# Patient Record
Sex: Female | Born: 1980 | Race: White | Hispanic: Yes | Marital: Married | Smoking: Never smoker
Health system: Southern US, Community
[De-identification: ages and names within clinical notes are randomized; demographics above are authoritative.]

## PROBLEM LIST (undated history)

## (undated) DIAGNOSIS — I10 Essential (primary) hypertension: Secondary | ICD-10-CM

## (undated) DIAGNOSIS — E119 Type 2 diabetes mellitus without complications: Secondary | ICD-10-CM

## (undated) DIAGNOSIS — O139 Gestational [pregnancy-induced] hypertension without significant proteinuria, unspecified trimester: Secondary | ICD-10-CM

## (undated) HISTORY — DX: Gestational (pregnancy-induced) hypertension without significant proteinuria, unspecified trimester: O13.9

## (undated) HISTORY — PX: NO PAST SURGERIES: SHX2092

---

## 2001-02-17 ENCOUNTER — Emergency Department (HOSPITAL_COMMUNITY): Admission: EM | Admit: 2001-02-17 | Discharge: 2001-02-17 | Payer: Self-pay | Admitting: Emergency Medicine

## 2001-02-17 ENCOUNTER — Encounter: Payer: Self-pay | Admitting: Emergency Medicine

## 2001-09-05 ENCOUNTER — Encounter: Payer: Self-pay | Admitting: *Deleted

## 2001-09-05 ENCOUNTER — Inpatient Hospital Stay (HOSPITAL_COMMUNITY): Admission: AD | Admit: 2001-09-05 | Discharge: 2001-09-05 | Payer: Self-pay | Admitting: *Deleted

## 2001-09-06 ENCOUNTER — Inpatient Hospital Stay (HOSPITAL_COMMUNITY): Admission: AD | Admit: 2001-09-06 | Discharge: 2001-09-06 | Payer: Self-pay | Admitting: *Deleted

## 2001-09-07 ENCOUNTER — Inpatient Hospital Stay (HOSPITAL_COMMUNITY): Admission: AD | Admit: 2001-09-07 | Discharge: 2001-09-10 | Payer: Self-pay | Admitting: Obstetrics and Gynecology

## 2005-02-20 ENCOUNTER — Inpatient Hospital Stay (HOSPITAL_COMMUNITY): Admission: AD | Admit: 2005-02-20 | Discharge: 2005-02-20 | Payer: Self-pay | Admitting: *Deleted

## 2005-05-21 ENCOUNTER — Inpatient Hospital Stay (HOSPITAL_COMMUNITY): Admission: AD | Admit: 2005-05-21 | Discharge: 2005-05-22 | Payer: Self-pay | Admitting: Obstetrics & Gynecology

## 2005-06-12 ENCOUNTER — Ambulatory Visit: Payer: Self-pay | Admitting: *Deleted

## 2005-06-26 ENCOUNTER — Ambulatory Visit: Payer: Self-pay | Admitting: Family Medicine

## 2005-06-26 ENCOUNTER — Ambulatory Visit (HOSPITAL_COMMUNITY): Admission: RE | Admit: 2005-06-26 | Discharge: 2005-06-26 | Payer: Self-pay | Admitting: Obstetrics & Gynecology

## 2005-07-10 ENCOUNTER — Ambulatory Visit: Payer: Self-pay | Admitting: Family Medicine

## 2005-07-24 ENCOUNTER — Ambulatory Visit: Payer: Self-pay | Admitting: *Deleted

## 2005-07-31 ENCOUNTER — Ambulatory Visit: Payer: Self-pay | Admitting: *Deleted

## 2005-07-31 ENCOUNTER — Ambulatory Visit (HOSPITAL_COMMUNITY): Admission: RE | Admit: 2005-07-31 | Discharge: 2005-07-31 | Payer: Self-pay | Admitting: Obstetrics & Gynecology

## 2005-08-03 ENCOUNTER — Ambulatory Visit: Payer: Self-pay | Admitting: Obstetrics & Gynecology

## 2005-08-07 ENCOUNTER — Ambulatory Visit: Payer: Self-pay | Admitting: *Deleted

## 2005-08-10 ENCOUNTER — Ambulatory Visit: Payer: Self-pay | Admitting: *Deleted

## 2005-08-13 ENCOUNTER — Ambulatory Visit: Payer: Self-pay | Admitting: *Deleted

## 2005-08-16 ENCOUNTER — Ambulatory Visit: Payer: Self-pay | Admitting: *Deleted

## 2005-08-20 ENCOUNTER — Ambulatory Visit: Payer: Self-pay | Admitting: *Deleted

## 2005-08-22 ENCOUNTER — Ambulatory Visit: Payer: Self-pay | Admitting: Obstetrics & Gynecology

## 2005-08-22 ENCOUNTER — Ambulatory Visit (HOSPITAL_COMMUNITY): Admission: RE | Admit: 2005-08-22 | Discharge: 2005-08-22 | Payer: Self-pay | Admitting: *Deleted

## 2005-08-30 ENCOUNTER — Ambulatory Visit: Payer: Self-pay | Admitting: *Deleted

## 2005-09-03 ENCOUNTER — Ambulatory Visit: Payer: Self-pay | Admitting: *Deleted

## 2005-09-05 ENCOUNTER — Ambulatory Visit: Payer: Self-pay | Admitting: Obstetrics & Gynecology

## 2005-09-07 ENCOUNTER — Inpatient Hospital Stay (HOSPITAL_COMMUNITY): Admission: AD | Admit: 2005-09-07 | Discharge: 2005-09-07 | Payer: Self-pay | Admitting: Obstetrics & Gynecology

## 2005-09-07 ENCOUNTER — Ambulatory Visit: Payer: Self-pay | Admitting: Family Medicine

## 2005-09-08 ENCOUNTER — Ambulatory Visit: Payer: Self-pay | Admitting: Family Medicine

## 2005-09-08 ENCOUNTER — Inpatient Hospital Stay (HOSPITAL_COMMUNITY): Admission: AD | Admit: 2005-09-08 | Discharge: 2005-09-10 | Payer: Self-pay | Admitting: Gynecology

## 2007-01-02 ENCOUNTER — Inpatient Hospital Stay (HOSPITAL_COMMUNITY): Admission: AD | Admit: 2007-01-02 | Discharge: 2007-01-03 | Payer: Self-pay | Admitting: Gynecology

## 2007-06-04 ENCOUNTER — Ambulatory Visit: Payer: Self-pay | Admitting: Obstetrics & Gynecology

## 2007-06-04 ENCOUNTER — Ambulatory Visit (HOSPITAL_COMMUNITY): Admission: RE | Admit: 2007-06-04 | Discharge: 2007-06-04 | Payer: Self-pay | Admitting: Gynecology

## 2007-06-09 ENCOUNTER — Encounter: Admission: RE | Admit: 2007-06-09 | Discharge: 2007-06-09 | Payer: Self-pay | Admitting: Obstetrics & Gynecology

## 2007-06-09 ENCOUNTER — Ambulatory Visit: Payer: Self-pay | Admitting: Obstetrics & Gynecology

## 2007-06-09 ENCOUNTER — Encounter: Payer: Self-pay | Admitting: Obstetrics & Gynecology

## 2007-06-16 ENCOUNTER — Ambulatory Visit: Payer: Self-pay | Admitting: Obstetrics & Gynecology

## 2007-06-23 ENCOUNTER — Ambulatory Visit: Payer: Self-pay | Admitting: Obstetrics & Gynecology

## 2007-06-23 ENCOUNTER — Inpatient Hospital Stay (HOSPITAL_COMMUNITY): Admission: AD | Admit: 2007-06-23 | Discharge: 2007-06-25 | Payer: Self-pay | Admitting: Gynecology

## 2007-07-08 ENCOUNTER — Inpatient Hospital Stay (HOSPITAL_COMMUNITY): Admission: AD | Admit: 2007-07-08 | Discharge: 2007-07-11 | Payer: Self-pay | Admitting: Obstetrics & Gynecology

## 2007-07-08 ENCOUNTER — Ambulatory Visit: Payer: Self-pay | Admitting: *Deleted

## 2008-11-16 ENCOUNTER — Encounter: Payer: Self-pay | Admitting: Obstetrics & Gynecology

## 2008-11-16 ENCOUNTER — Ambulatory Visit: Payer: Self-pay | Admitting: Obstetrics and Gynecology

## 2008-11-16 LAB — CONVERTED CEMR LAB
Eosinophils Absolute: 0 10*3/uL (ref 0.0–0.7)
HCT: 39.9 % (ref 36.0–46.0)
Hemoglobin: 13.8 g/dL (ref 12.0–15.0)
Hepatitis B Surface Ag: NEGATIVE
Lymphs Abs: 2 10*3/uL (ref 0.7–4.0)
MCV: 91.3 fL (ref 78.0–100.0)
Neutrophils Relative %: 77 % (ref 43–77)
Rh Type: POSITIVE
Rubella: 328.4 intl units/mL — ABNORMAL HIGH

## 2008-11-17 ENCOUNTER — Ambulatory Visit: Payer: Self-pay | Admitting: Obstetrics & Gynecology

## 2008-11-17 ENCOUNTER — Encounter: Payer: Self-pay | Admitting: Physician Assistant

## 2008-11-17 LAB — CONVERTED CEMR LAB: Chlamydia, Swab/Urine, PCR: NEGATIVE

## 2008-11-22 ENCOUNTER — Ambulatory Visit (HOSPITAL_COMMUNITY): Admission: RE | Admit: 2008-11-22 | Discharge: 2008-11-22 | Payer: Self-pay | Admitting: Obstetrics and Gynecology

## 2008-12-15 ENCOUNTER — Ambulatory Visit: Payer: Self-pay | Admitting: Obstetrics & Gynecology

## 2008-12-16 ENCOUNTER — Encounter: Payer: Self-pay | Admitting: Obstetrics and Gynecology

## 2008-12-20 ENCOUNTER — Ambulatory Visit: Payer: Self-pay | Admitting: Obstetrics & Gynecology

## 2008-12-20 ENCOUNTER — Encounter: Admission: RE | Admit: 2008-12-20 | Discharge: 2009-03-20 | Payer: Self-pay | Admitting: Obstetrics and Gynecology

## 2008-12-20 ENCOUNTER — Ambulatory Visit (HOSPITAL_COMMUNITY): Admission: RE | Admit: 2008-12-20 | Discharge: 2008-12-20 | Payer: Self-pay | Admitting: Obstetrics & Gynecology

## 2008-12-27 ENCOUNTER — Ambulatory Visit: Payer: Self-pay | Admitting: Obstetrics & Gynecology

## 2009-01-03 ENCOUNTER — Ambulatory Visit: Payer: Self-pay | Admitting: Obstetrics & Gynecology

## 2009-01-17 ENCOUNTER — Ambulatory Visit: Payer: Self-pay | Admitting: Obstetrics & Gynecology

## 2009-01-19 ENCOUNTER — Ambulatory Visit (HOSPITAL_COMMUNITY): Admission: RE | Admit: 2009-01-19 | Discharge: 2009-01-19 | Payer: Self-pay | Admitting: Family Medicine

## 2009-01-31 ENCOUNTER — Ambulatory Visit: Payer: Self-pay | Admitting: Obstetrics & Gynecology

## 2009-01-31 ENCOUNTER — Encounter: Payer: Self-pay | Admitting: Obstetrics & Gynecology

## 2009-02-21 ENCOUNTER — Ambulatory Visit: Payer: Self-pay | Admitting: Obstetrics & Gynecology

## 2009-02-21 LAB — CONVERTED CEMR LAB
HCT: 36.4 % (ref 36.0–46.0)
MCHC: 33.2 g/dL (ref 30.0–36.0)
MCV: 96 fL (ref 78.0–100.0)
Platelets: 184 10*3/uL (ref 150–400)
RDW: 13.6 % (ref 11.5–15.5)
WBC: 10.5 10*3/uL (ref 4.0–10.5)

## 2009-02-24 ENCOUNTER — Ambulatory Visit (HOSPITAL_COMMUNITY): Admission: RE | Admit: 2009-02-24 | Discharge: 2009-02-24 | Payer: Self-pay | Admitting: Family Medicine

## 2009-02-28 ENCOUNTER — Ambulatory Visit: Payer: Self-pay | Admitting: Obstetrics & Gynecology

## 2009-03-17 ENCOUNTER — Ambulatory Visit: Payer: Self-pay | Admitting: Obstetrics & Gynecology

## 2009-03-24 ENCOUNTER — Ambulatory Visit: Payer: Self-pay | Admitting: Obstetrics & Gynecology

## 2009-03-28 ENCOUNTER — Ambulatory Visit: Payer: Self-pay | Admitting: Obstetrics & Gynecology

## 2009-03-28 ENCOUNTER — Ambulatory Visit (HOSPITAL_COMMUNITY): Admission: RE | Admit: 2009-03-28 | Discharge: 2009-03-28 | Payer: Self-pay | Admitting: Obstetrics and Gynecology

## 2009-03-31 ENCOUNTER — Ambulatory Visit: Payer: Self-pay | Admitting: Obstetrics & Gynecology

## 2009-04-07 ENCOUNTER — Ambulatory Visit: Payer: Self-pay | Admitting: Obstetrics & Gynecology

## 2009-04-11 ENCOUNTER — Ambulatory Visit: Payer: Self-pay | Admitting: Family Medicine

## 2009-04-11 ENCOUNTER — Encounter: Admission: RE | Admit: 2009-04-11 | Discharge: 2009-04-11 | Payer: Self-pay | Admitting: Obstetrics & Gynecology

## 2009-04-14 ENCOUNTER — Ambulatory Visit: Payer: Self-pay | Admitting: Obstetrics & Gynecology

## 2009-04-21 ENCOUNTER — Ambulatory Visit: Payer: Self-pay | Admitting: Obstetrics & Gynecology

## 2009-04-25 ENCOUNTER — Ambulatory Visit: Payer: Self-pay | Admitting: Obstetrics & Gynecology

## 2009-04-26 ENCOUNTER — Encounter: Payer: Self-pay | Admitting: Family

## 2009-05-02 ENCOUNTER — Ambulatory Visit (HOSPITAL_COMMUNITY): Admission: RE | Admit: 2009-05-02 | Discharge: 2009-05-02 | Payer: Self-pay | Admitting: Family Medicine

## 2009-05-02 ENCOUNTER — Ambulatory Visit: Payer: Self-pay | Admitting: Obstetrics & Gynecology

## 2009-05-04 ENCOUNTER — Ambulatory Visit: Payer: Self-pay | Admitting: Obstetrics & Gynecology

## 2009-05-09 ENCOUNTER — Ambulatory Visit: Payer: Self-pay | Admitting: Obstetrics & Gynecology

## 2009-05-13 ENCOUNTER — Inpatient Hospital Stay (HOSPITAL_COMMUNITY): Admission: AD | Admit: 2009-05-13 | Discharge: 2009-05-16 | Payer: Self-pay | Admitting: Obstetrics & Gynecology

## 2009-05-13 ENCOUNTER — Ambulatory Visit: Payer: Self-pay | Admitting: Obstetrics & Gynecology

## 2009-08-13 ENCOUNTER — Emergency Department (HOSPITAL_COMMUNITY): Admission: EM | Admit: 2009-08-13 | Discharge: 2009-08-13 | Payer: Self-pay | Admitting: Psychiatry

## 2010-07-02 ENCOUNTER — Encounter: Payer: Self-pay | Admitting: *Deleted

## 2010-09-12 LAB — GLUCOSE, CAPILLARY
Glucose-Capillary: 47 mg/dL — ABNORMAL LOW (ref 70–99)
Glucose-Capillary: 74 mg/dL (ref 70–99)
Glucose-Capillary: 79 mg/dL (ref 70–99)

## 2010-09-12 LAB — CBC
HCT: 37.9 % (ref 36.0–46.0)
RBC: 3.92 MIL/uL (ref 3.87–5.11)

## 2010-09-12 LAB — WET PREP, GENITAL: Clue Cells Wet Prep HPF POC: NONE SEEN

## 2010-09-13 LAB — POCT URINALYSIS DIP (DEVICE)
Bilirubin Urine: NEGATIVE
Bilirubin Urine: NEGATIVE
Bilirubin Urine: NEGATIVE
Glucose, UA: NEGATIVE mg/dL
Glucose, UA: NEGATIVE mg/dL
Glucose, UA: NEGATIVE mg/dL
Hgb urine dipstick: NEGATIVE
Ketones, ur: NEGATIVE mg/dL
Ketones, ur: NEGATIVE mg/dL
Ketones, ur: NEGATIVE mg/dL
Nitrite: NEGATIVE
Nitrite: NEGATIVE
Protein, ur: NEGATIVE mg/dL
Protein, ur: NEGATIVE mg/dL
Protein, ur: NEGATIVE mg/dL
Specific Gravity, Urine: 1.01 (ref 1.005–1.030)
Specific Gravity, Urine: 1.01 (ref 1.005–1.030)
Specific Gravity, Urine: 1.015 (ref 1.005–1.030)
Urobilinogen, UA: 0.2 mg/dL (ref 0.0–1.0)
Urobilinogen, UA: 0.2 mg/dL (ref 0.0–1.0)
Urobilinogen, UA: 0.2 mg/dL (ref 0.0–1.0)

## 2010-09-14 LAB — POCT URINALYSIS DIP (DEVICE)
Bilirubin Urine: NEGATIVE
Bilirubin Urine: NEGATIVE
Glucose, UA: NEGATIVE mg/dL
Glucose, UA: NEGATIVE mg/dL
Hgb urine dipstick: NEGATIVE
Ketones, ur: NEGATIVE mg/dL
Ketones, ur: NEGATIVE mg/dL
Ketones, ur: NEGATIVE mg/dL
Nitrite: NEGATIVE
Specific Gravity, Urine: 1.01 (ref 1.005–1.030)
Specific Gravity, Urine: 1.015 (ref 1.005–1.030)

## 2010-09-15 LAB — POCT URINALYSIS DIP (DEVICE)
Bilirubin Urine: NEGATIVE
Bilirubin Urine: NEGATIVE
Glucose, UA: NEGATIVE mg/dL
Hgb urine dipstick: NEGATIVE
Hgb urine dipstick: NEGATIVE
Ketones, ur: NEGATIVE mg/dL
Ketones, ur: NEGATIVE mg/dL
Specific Gravity, Urine: 1.02 (ref 1.005–1.030)
Specific Gravity, Urine: 1.02 (ref 1.005–1.030)
pH: 7 (ref 5.0–8.0)

## 2010-09-16 LAB — POCT URINALYSIS DIP (DEVICE)
Bilirubin Urine: NEGATIVE
Ketones, ur: NEGATIVE mg/dL
Ketones, ur: NEGATIVE mg/dL
Protein, ur: NEGATIVE mg/dL
Specific Gravity, Urine: 1.015 (ref 1.005–1.030)
pH: 8 (ref 5.0–8.0)

## 2010-09-17 LAB — POCT URINALYSIS DIP (DEVICE)
Bilirubin Urine: NEGATIVE
Bilirubin Urine: NEGATIVE
Bilirubin Urine: NEGATIVE
Glucose, UA: NEGATIVE mg/dL
Glucose, UA: NEGATIVE mg/dL
Glucose, UA: NEGATIVE mg/dL
Glucose, UA: NEGATIVE mg/dL
Ketones, ur: NEGATIVE mg/dL
Nitrite: NEGATIVE
Protein, ur: 30 mg/dL — AB
Specific Gravity, Urine: 1.02 (ref 1.005–1.030)
Specific Gravity, Urine: 1.02 (ref 1.005–1.030)
Urobilinogen, UA: 0.2 mg/dL (ref 0.0–1.0)
Urobilinogen, UA: 0.2 mg/dL (ref 0.0–1.0)
pH: 7.5 (ref 5.0–8.0)

## 2010-09-17 LAB — GLUCOSE, CAPILLARY: Glucose-Capillary: 82 mg/dL (ref 70–99)

## 2010-09-18 LAB — POCT URINALYSIS DIP (DEVICE)
Glucose, UA: NEGATIVE mg/dL
Nitrite: NEGATIVE
Specific Gravity, Urine: 1.02 (ref 1.005–1.030)
Urobilinogen, UA: 0.2 mg/dL (ref 0.0–1.0)

## 2010-10-24 NOTE — Discharge Summary (Signed)
Virginia Dyer, Virginia Dyer      ACCOUNT NO.:  [id number]   MEDICAL RECORD NO.:  [id number]          PATIENT TYPE:  INP   LOCATION:  9311                          FACILITY:  [facility]   PHYSICIAN:  Allie Bossier, MD        DATE OF BIRTH:  02/07/1977   DATE OF ADMISSION:  07/08/2007  DATE OF DISCHARGE:  07/11/2007                               DISCHARGE SUMMARY   ADMITTING DIAGNOSES:  1. Fever.  2. Lower abdominal pain.  3. Vaginal discharge.  4. Postpartum  day #15.  5. Spontaneous vaginal delivery.   DISCHARGE DIAGNOSES:  1. Endomyometritis, afebrile times 24 hours.  2. Postpartum  day #18.  3. Spontaneous vaginal delivery.   PROCEDURES:  1. The patient had a CT of the abdomen and pelvis with contrast      showing increased soft tissue density and fluid within the uterine      cavity suggesting possible retained parts of conception.  There was      no evidence for appendicitis.  2. There was a gynecological ultrasound of the uterus showing fluid in      the uterus with low likelihood of any retained products of      conception.  Report is not available for me to review. This is      verbal from the radiologist.  3. The patient had a pelvic ultrasound that showed only minimal      irregularity of the endometrial canal anteriorly suggesting minimal      if any retained products.  Otherwise normal postpartum  appearance.   CONSULTATIONS:  None.   COMPLICATIONS:  None.   PERTINENT LABORATORY FINDINGS:  On admission the patient had a CBC with  white blood cell count 12.8, hemoglobin 12.3, hematocrit 35.5, platelets  197, neutrophils 84, lymphocytes 11.  Urinalysis had small bilirubin,  moderate blood, 30 protein, but was otherwise negative.  Microscopy  showed few bacteria, 3 to 6 white blood cells, 3 to 6 red blood cells.  Wet prep was negative.  Complete metabolic panel showed sodium 137,  potassium 3.8, glucose 103, BUN 14, creatinine 0.62.  AST 21, ALT 18.  Total bilirubin  0.8.  Gonorrhea and Chlamydia probes were both negative.  Followup complete blood count on hospital day #2 showed white blood cell  count of 13.2, hemoglobin 10.6, hematocrit 30.7 and platelet count  164.  On hospital day #4 she has another CBC showing white blood cell count of  9.3, hemoglobin 10.2, hematocrit 28.6, platelets 191.   BRIEF PERTINENT ADMISSION HISTORY:  Virginia Dyer is a gravida 3, para  3, 0-0-3 presenting postpartum  day #15 from spontaneous vaginal  delivery complaining of worsening pelvic pain and discharge for the past  3 to 4 days.  She states that she had a normal  postpartum course but  developed pain beginning Saturday, January 24. She also notes a  yellowish sick looking discharge.  She states she has also bee  afebrile and slightly nauseous.  She denies vomiting or diarrhea.  She  is admitted for evaluation and management.   HOSPITAL COURSE:  Upon admission She was found to have a  temperature of  104.2.  The white blood cell count was noted to be 13.2 with a left  shift.  A CT of the abdomen and pelvis showed possible retained products  of conception. She was started on antibiotics of ampicillin,  clindamycin, and gentamicin.  Her fever persisted throughout the  hospital day 1 and into hospital day 3.  Her fevers were not as high  initially with her last temperature being on January 29 at 10:00 a.m. at  100.8.  After CT of the abdomen and pelvis showed possible products of  conception but no evidence of appendicitis an ultrasound was performed.  The ultrasound showed minimal irregularities with low likelihood of  retained products.  It was felt that there was very low likelihood for  retained products due to review of the chart showing an intact placenta  and no bleeding.  The patient's Gonorrhea and Chlamydia tests were  normal.  Initial speculum examination showed large amount of purulent  drainage from the cervical os.  On hospital day #4 prior to  discharge  she was complaining of only mild lower abdominal pain. This has improved  significantly since time of admission.  She denies subjective fever and  looked stable and in good condition for discharge.  She had been on  antibiotics times 72 plus hours and had defervesced. She was in stable  condition to be discharged.   DISCHARGE STATUS:  Stable.   DISCHARGE MEDICATIONS:  1. Ciprofloxacin 500 mg 1 tablet by mouth twice daily for 7 days.  2. Flagyl 500 mg 1 tablet every 6 hours for 7 days.   DISCHARGE INSTRUCTIONS:  1. Discharge to home .  2. Regular diet.  3. No sex for 2 weeks, otherwise activity as tolerated.  4. The patient is to follow up as needed at the [facility] or the [facility].  5. If the patient spikes high fever and develops vaginal bleeding, or      pain worsens she is to return to the maternity admissions unit.      Karlton Lemon, MD  Electronically Signed     ______________________________  Allie Bossier, MD    NS/MEDQ  D:  07/11/2007  T:  07/11/2007  Job:  [id number]

## 2011-03-01 LAB — COMPREHENSIVE METABOLIC PANEL
ALT: 13
ALT: 18
AST: 17
BUN: 14
CO2: 23
CO2: 27
Calcium: 8.8
Chloride: 105
GFR calc Af Amer: >60
GFR calc Af Amer: >60
GFR calc non Af Amer: >60
GFR calc non Af Amer: >60
Glucose, Bld: 103 — ABNORMAL HIGH
Potassium: 3.4 — ABNORMAL LOW
Sodium: 137
Total Bilirubin: 0.5
Total Protein: 6.3

## 2011-03-01 LAB — CBC
HCT: 28.6 — ABNORMAL LOW
HCT: 30.7 — ABNORMAL LOW
HCT: 35.5 — ABNORMAL LOW
Hemoglobin: 10.2 — ABNORMAL LOW
Hemoglobin: 10.6 — ABNORMAL LOW
MCHC: 35.8
MCV: 101.5 — ABNORMAL HIGH
MCV: 93.2
MCV: 93.4
Platelets: 164
Platelets: 197
RBC: 3.96
RBC: 3.3 — ABNORMAL LOW
RBC: 3.81 — ABNORMAL LOW
RDW: 13.8
WBC: 13.3 — ABNORMAL HIGH
WBC: 12.8 — ABNORMAL HIGH
WBC: 13.2 — ABNORMAL HIGH

## 2011-03-01 LAB — URINE MICROSCOPIC-ADD ON

## 2011-03-01 LAB — URINALYSIS, ROUTINE W REFLEX MICROSCOPIC
Glucose, UA: NEGATIVE
Leukocytes, UA: NEGATIVE
pH: 6.5

## 2011-03-01 LAB — DIFFERENTIAL
Eosinophils Absolute: 0
Eosinophils Relative: 0
Lymphocytes Relative: 11 — ABNORMAL LOW
Lymphs Abs: 1.4
Monocytes Relative: 5
Neutrophils Relative %: 84 — ABNORMAL HIGH

## 2011-03-01 LAB — POCT URINALYSIS DIP (DEVICE)
Glucose, UA: NEGATIVE
Nitrite: NEGATIVE
Operator id: 194561
Urobilinogen, UA: 0.2

## 2011-03-01 LAB — APTT: aPTT: 28

## 2011-03-01 LAB — WET PREP, GENITAL

## 2011-03-08 IMAGING — US US OB COMP +14 WK
2 of 3 series · 14 of 28 positions shown · non-contrast
Comparison: none

OBSTETRICAL ULTRASOUND:
 This ultrasound exam was performed in the [HOSPITAL] Ultrasound Department.  The OB US report was generated in the AS system, and faxed to the ordering physician.  This report is also available in [REDACTED] PACS.

[Series 1: us ob comp +14 wk · 0.21mm/px · 2 of 10 slices shown (1 of 2)]
[im 1/10]
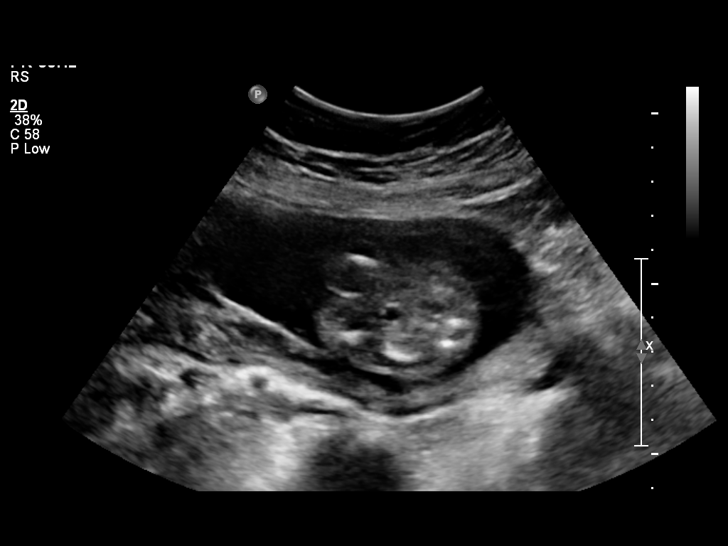
[im 7/10]
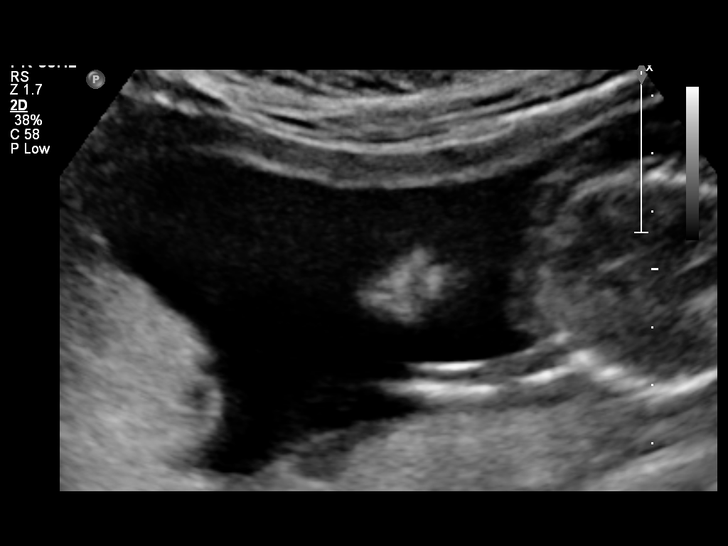

[Series 1: us ob comp +14 wk · 0.21mm/px · 66 acquisitions, 12 frames shown (2 of 2)]
[im 1/66]
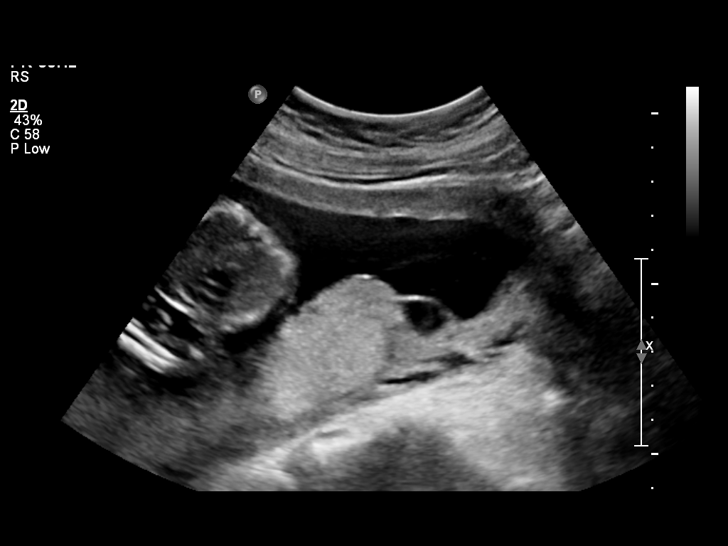
[im 6/66]
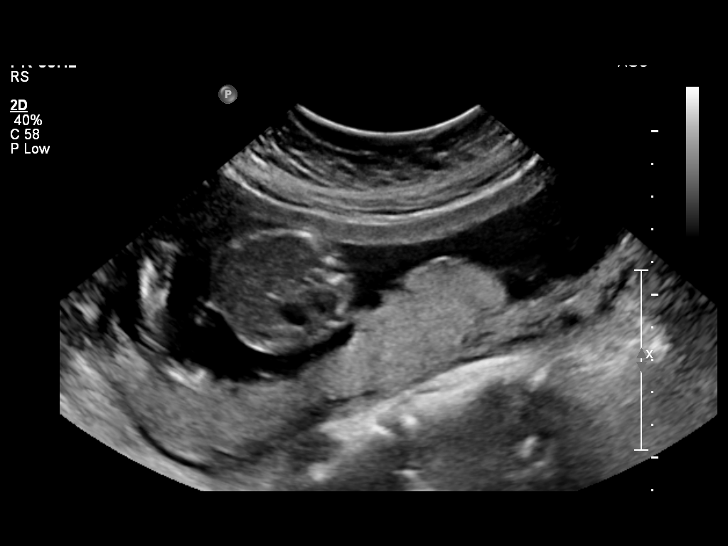
[im 12/66]
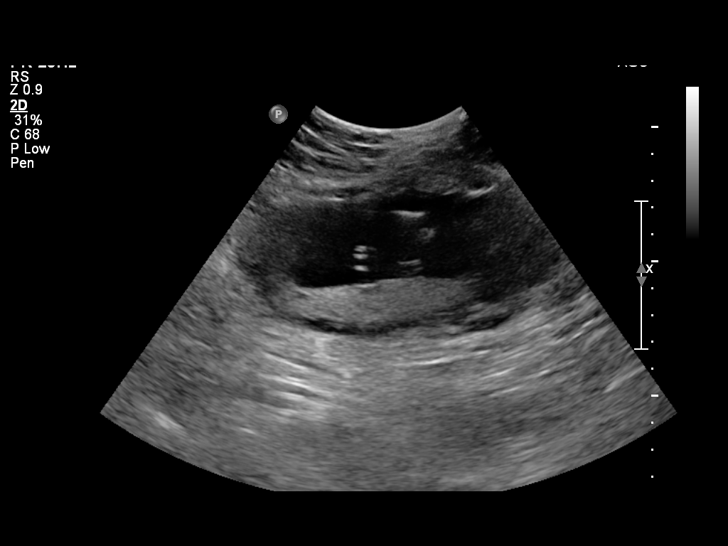
[im 18/66]
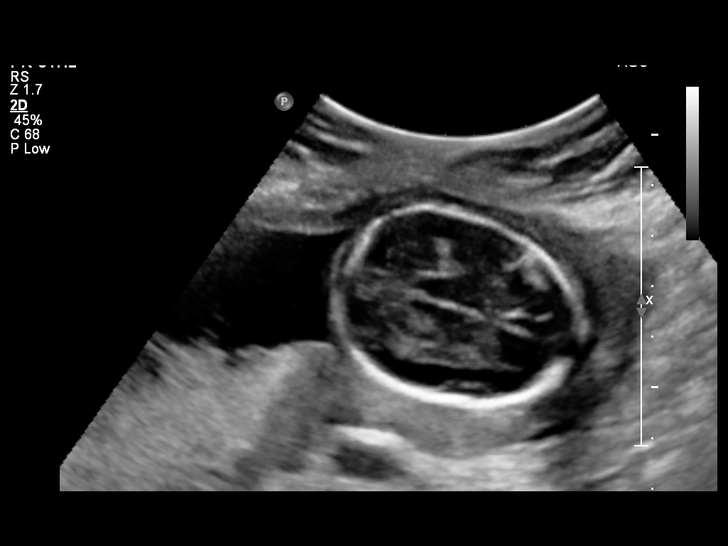
[im 24/66]
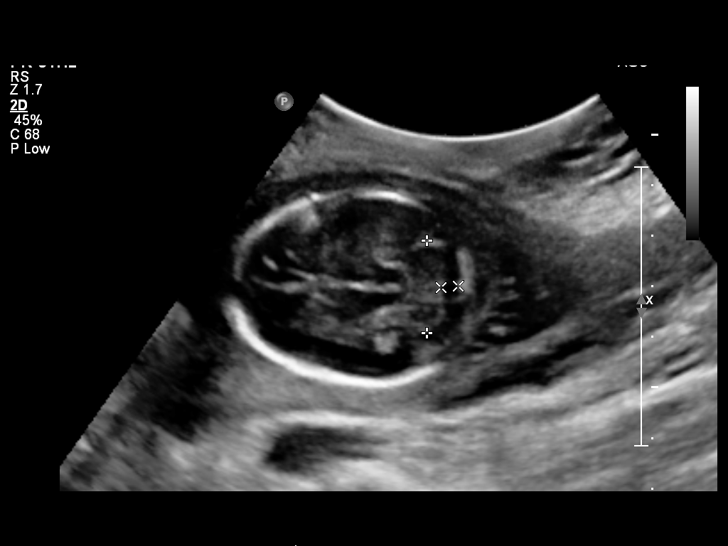
[im 30/66]
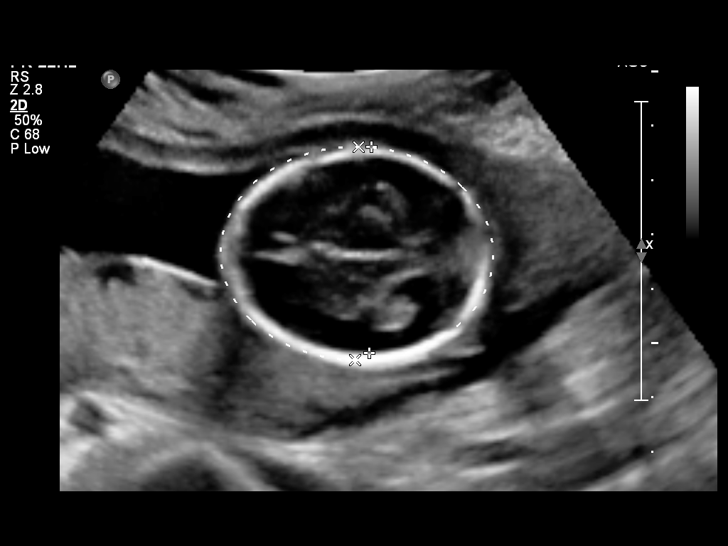
[im 36/66]
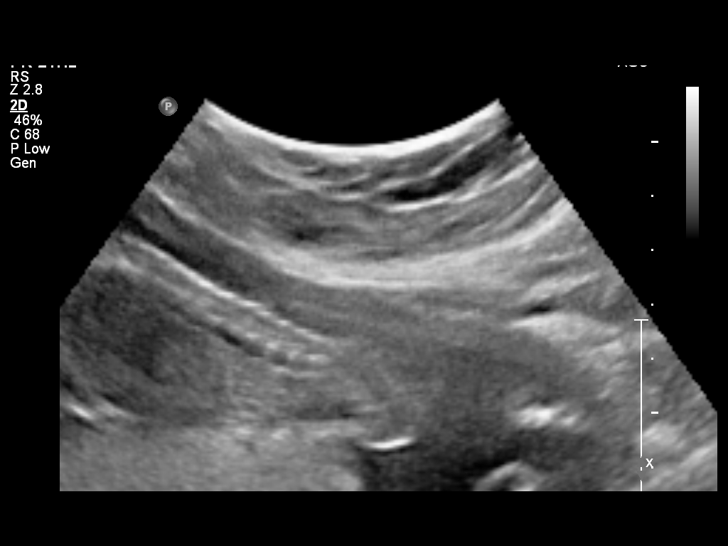
[im 42/66]
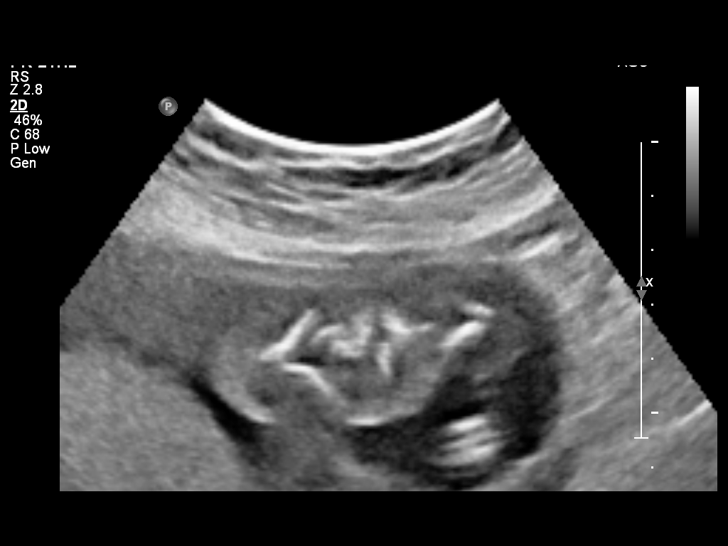
[im 48/66]
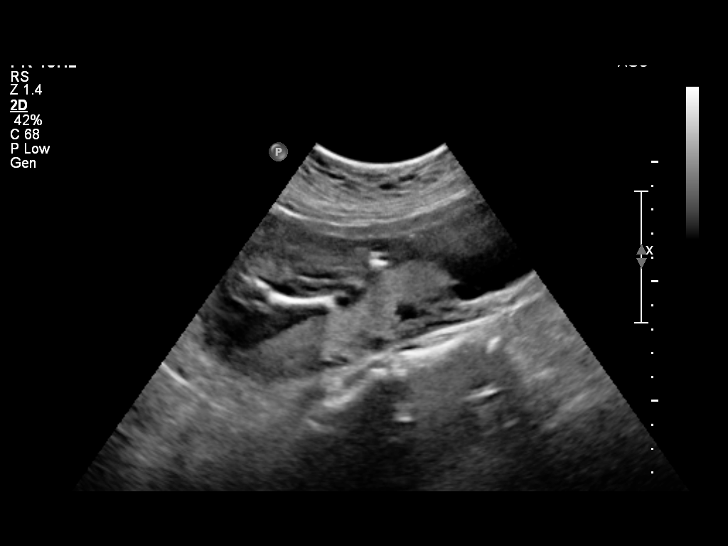
[im 54/66]
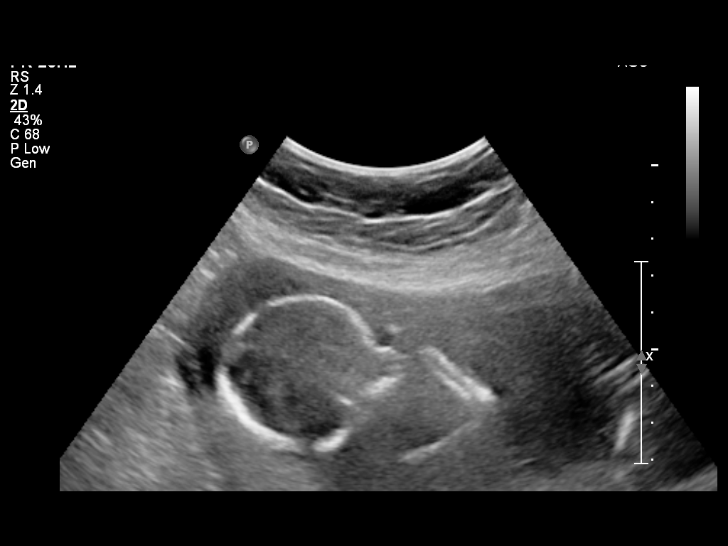
[im 60/66]
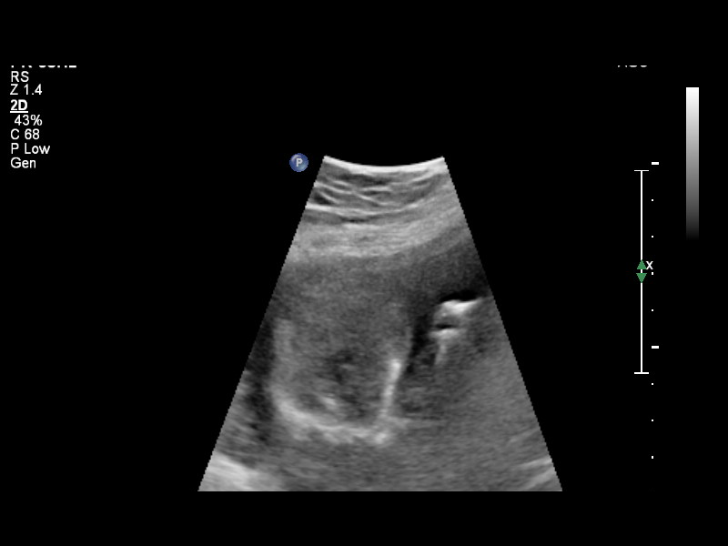
[im 66/66]
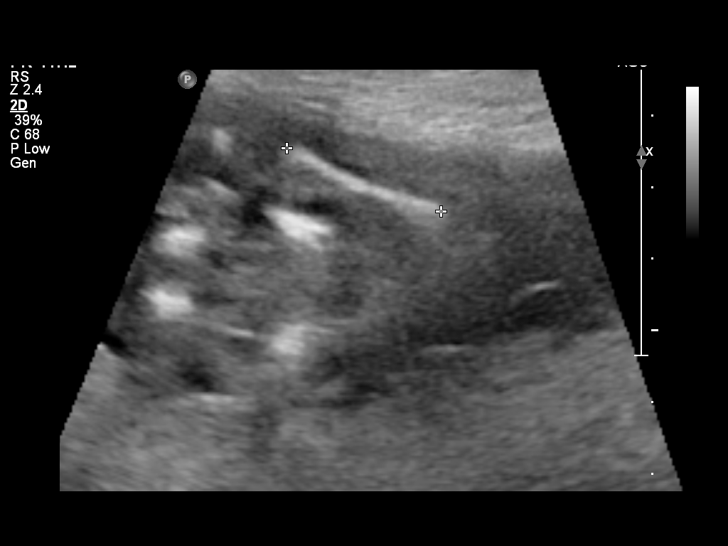

[14 of 28 positions shown; findings below may reference images not displayed]

IMPRESSION: See AS Obstetric US report.

## 2011-03-16 LAB — POCT URINALYSIS DIP (DEVICE)
Bilirubin Urine: NEGATIVE
Bilirubin Urine: NEGATIVE
Nitrite: NEGATIVE
Nitrite: NEGATIVE
Protein, ur: NEGATIVE
Protein, ur: NEGATIVE
pH: 7.5
pH: 7.5

## 2011-03-26 LAB — URINALYSIS, ROUTINE W REFLEX MICROSCOPIC
Bilirubin Urine: NEGATIVE
Specific Gravity, Urine: <1.005 — ABNORMAL LOW
Urobilinogen, UA: 0.2
pH: 6

## 2011-03-26 LAB — CBC
Platelets: 176
RDW: 14

## 2011-03-26 LAB — URINE MICROSCOPIC-ADD ON: WBC, UA: NONE SEEN

## 2011-03-26 LAB — ABO/RH: ABO/RH(D): O POS

## 2011-03-26 LAB — WET PREP, GENITAL: Yeast Wet Prep HPF POC: NONE SEEN

## 2011-03-26 LAB — POCT PREGNANCY, URINE
Operator id: 114931
Preg Test, Ur: POSITIVE

## 2011-04-07 IMAGING — US US OB FOLLOW-UP
2 series · 14 of 28 positions shown · non-contrast
Comparison: none

OBSTETRICAL ULTRASOUND:
 This ultrasound exam was performed in the [HOSPITAL] Ultrasound Department.  The OB US report was generated in the AS system, and faxed to the ordering physician.  This report is also available in [REDACTED] PACS.

[Series 1: us ob follow up · 0.27mm/px · 4 acquisitions, 1 frame shown (1 of 2)]
[im 4/4]
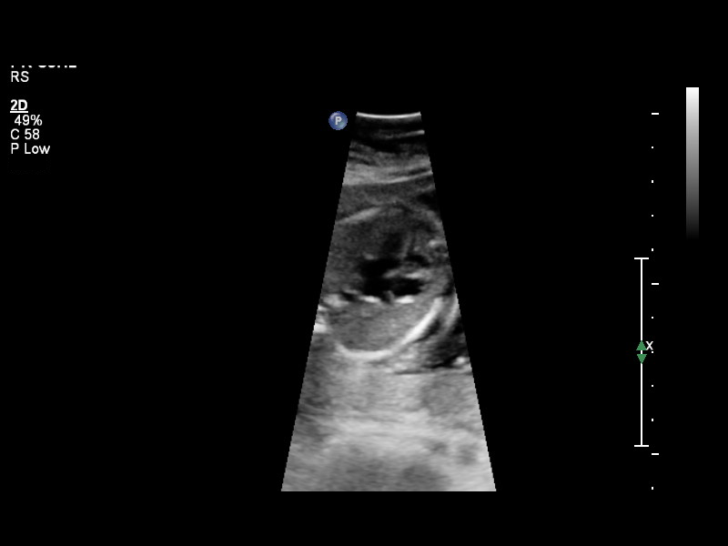

[Series 1: us ob follow up · 0.21mm/px · 13 of 44 slices shown (2 of 2)]
[im 2/44]
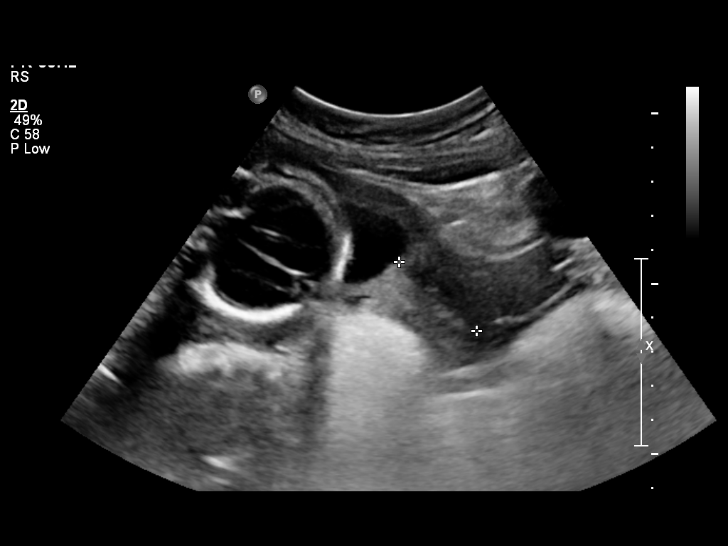
[im 6/44]
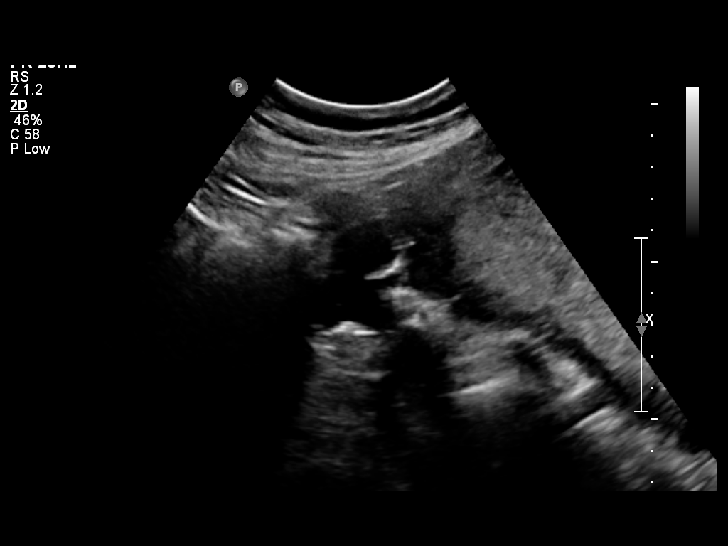
[im 9/44]
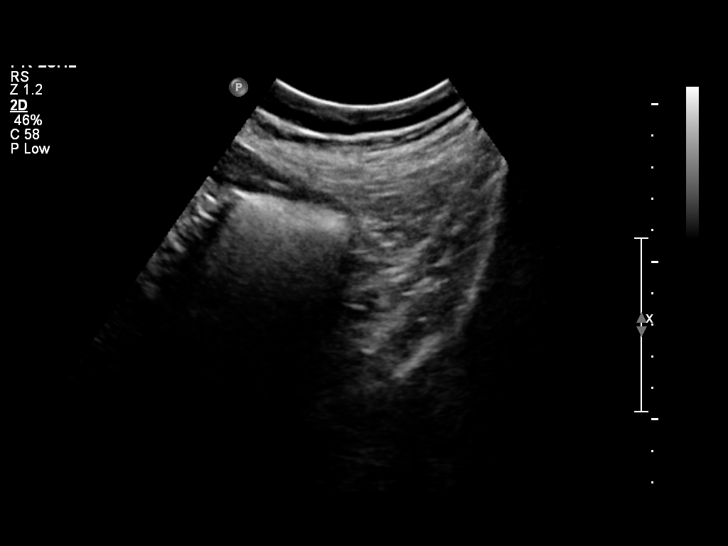
[im 13/44]
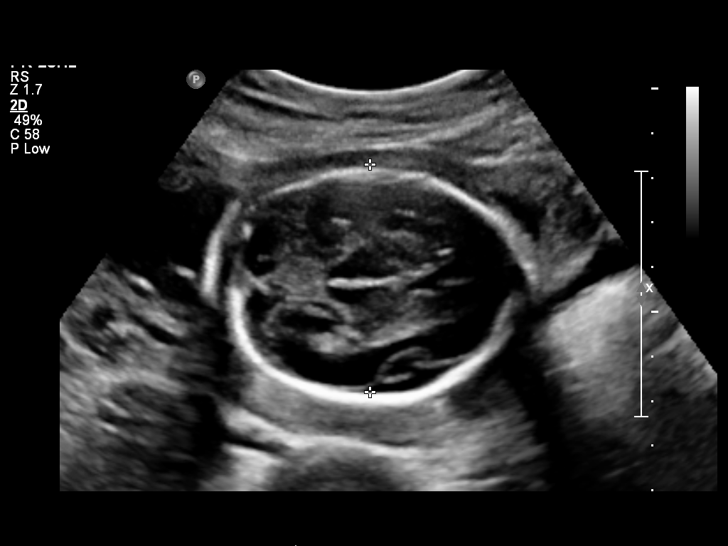
[im 16/44]
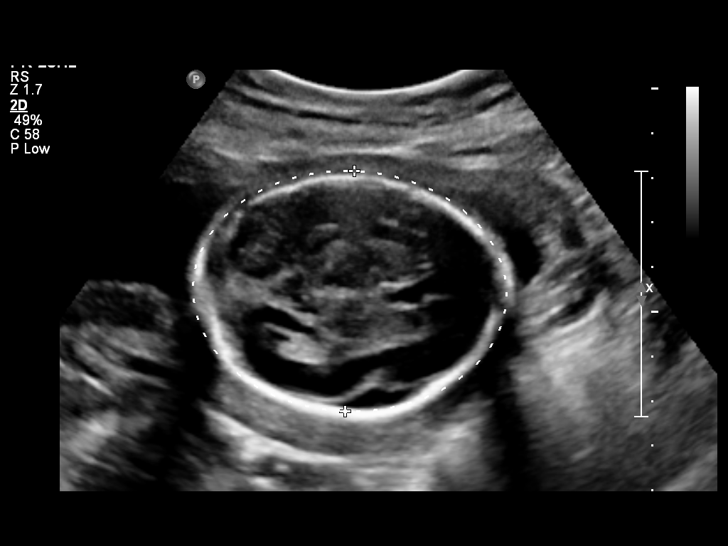
[im 19/44]
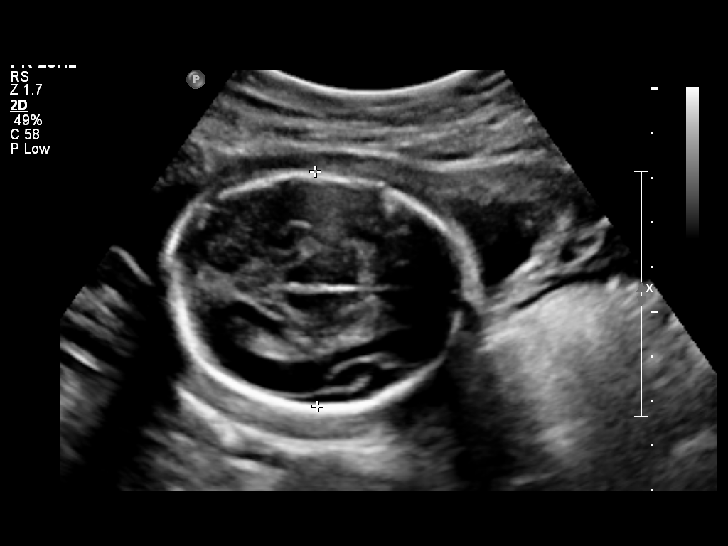
[im 23/44]
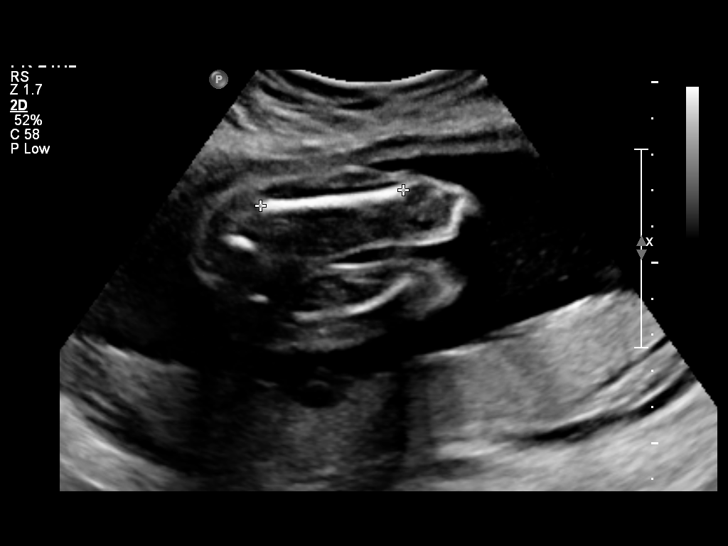
[im 26/44]
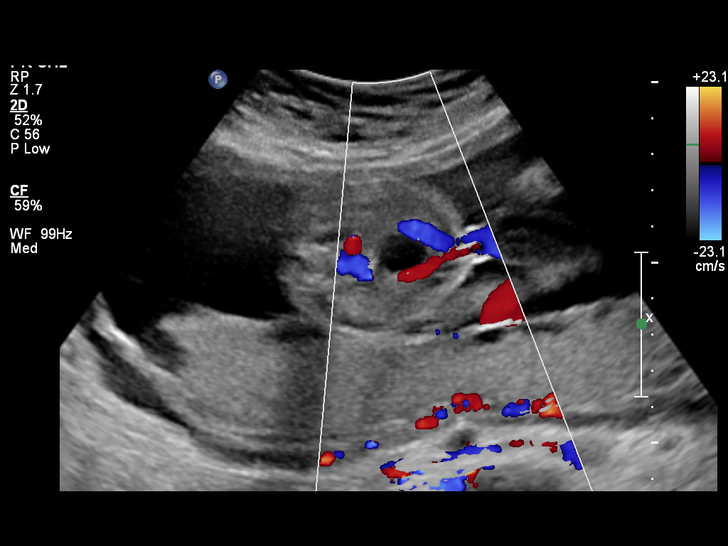
[im 30/44]
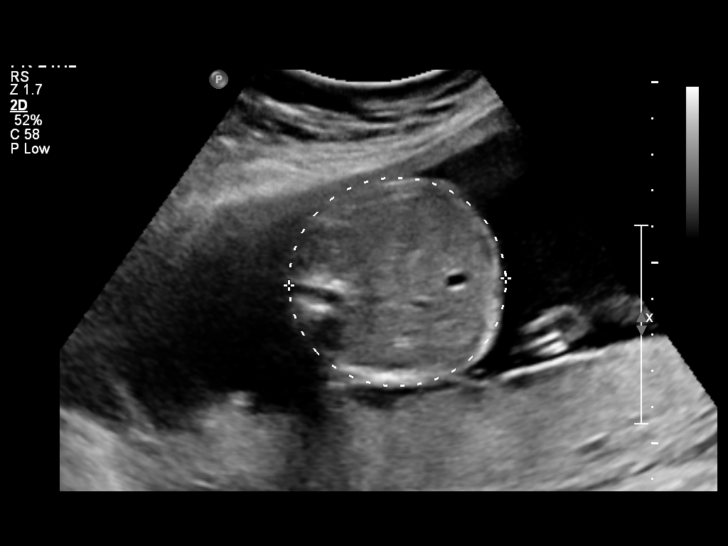
[im 33/44]
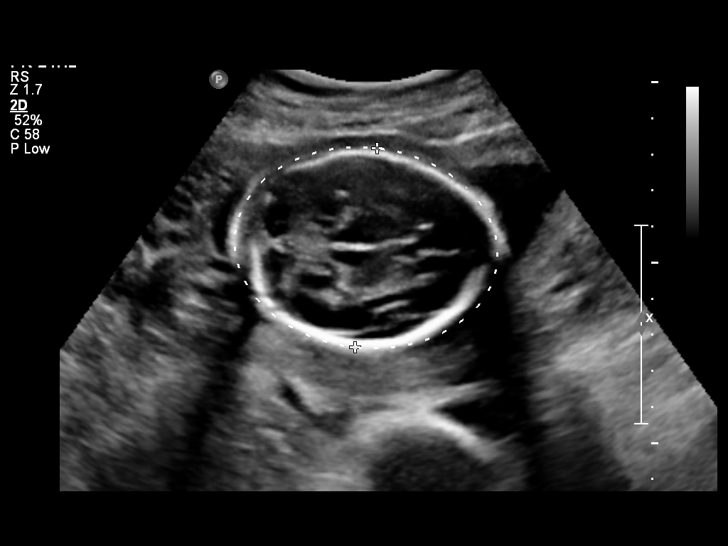
[im 37/44]
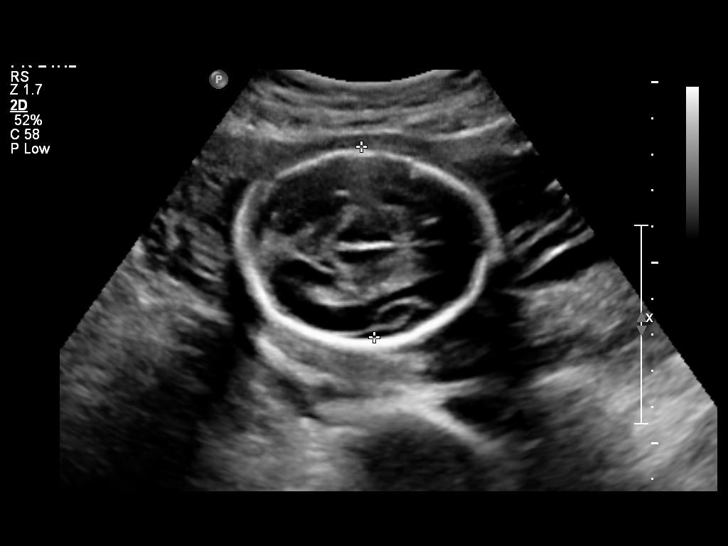
[im 40/44]
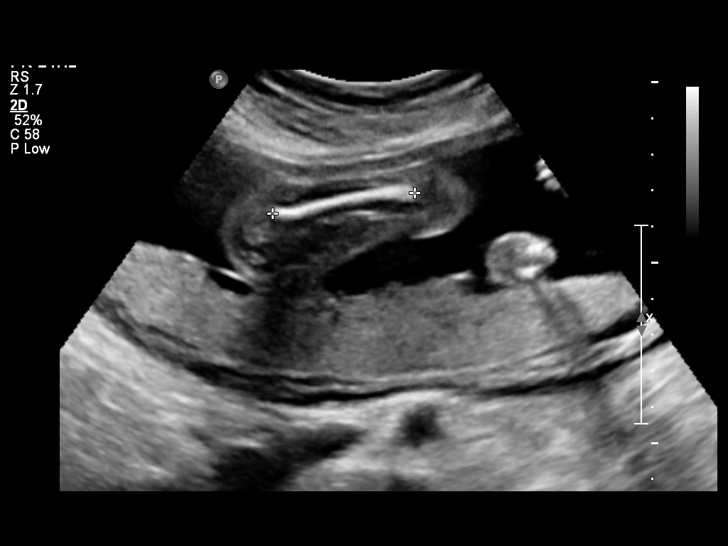
[im 44/44]
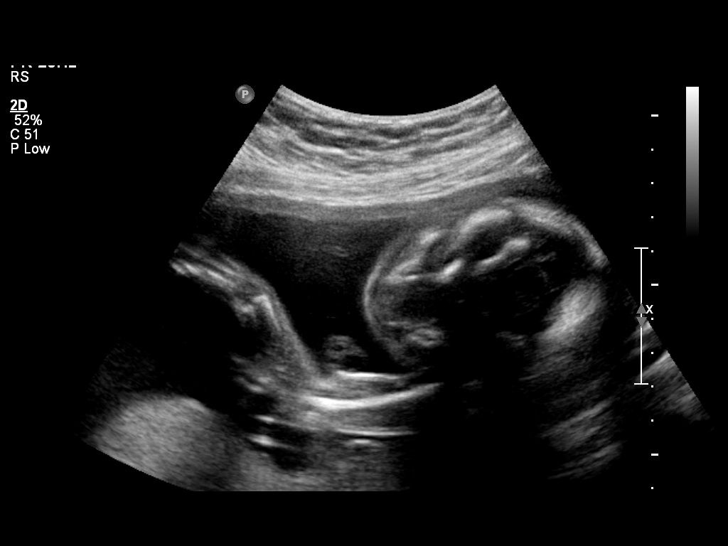

[14 of 28 positions shown; findings below may reference images not displayed]

IMPRESSION: See AS Obstetric US report.

## 2013-04-13 ENCOUNTER — Emergency Department (HOSPITAL_COMMUNITY)
Admission: EM | Admit: 2013-04-13 | Discharge: 2013-04-13 | Disposition: A | Discharge status: Home / Self Care | Payer: Self-pay | Attending: Emergency Medicine | Admitting: Emergency Medicine

## 2013-04-13 ENCOUNTER — Encounter (HOSPITAL_COMMUNITY): Payer: Self-pay | Admitting: Emergency Medicine

## 2013-04-13 DIAGNOSIS — R509 Fever, unspecified: Secondary | ICD-10-CM | POA: Insufficient documentation

## 2013-04-13 DIAGNOSIS — J029 Acute pharyngitis, unspecified: Secondary | ICD-10-CM | POA: Insufficient documentation

## 2013-04-13 DIAGNOSIS — F17200 Nicotine dependence, unspecified, uncomplicated: Secondary | ICD-10-CM | POA: Insufficient documentation

## 2013-04-13 DIAGNOSIS — H6690 Otitis media, unspecified, unspecified ear: Secondary | ICD-10-CM | POA: Insufficient documentation

## 2013-04-13 MED ORDER — AMOXICILLIN 500 MG PO CAPS: 500.0000 mg | ORAL_CAPSULE | Freq: Three times a day (TID) | ORAL | Status: DC

## 2013-04-13 NOTE — ED Provider Notes (Signed)
Medical screening examination/treatment/procedure(s) were performed by non-physician practitioner and as supervising physician I was immediately available for consultation/collaboration.  EKG Interpretation   None         Daizy Outen H Milo Solana, MD 04/13/13 2317 

## 2013-04-13 NOTE — ED Provider Notes (Signed)
CSN: [id number]     Arrival date & time 04/13/13  2010 History  This chart was scribed for non-physician practitioner, Sharilyn Sites, PA-C working with Richardean Canal, MD by Greggory Stallion, ED scribe. This patient was seen in room TR06C/TR06C and the patient's care was started at 8:38 PM.   Chief Complaint  Patient presents with  . Otalgia   The history is provided by the patient. No language interpreter was used.   HPI Comments: Virginia Dyer is a 32 y.o. female who presents to the Emergency Department complaining of right otalgia and sore throat that started 20 days ago. No drainage from ear.  She states she has also had fever for 3 days but has taken Tylenol with relief. Pt states her child was sick with similar sx-- currently on abx.  States it is painful to swallow but no difficulty doing so.  Afebrile on arrival.  History reviewed. No pertinent past medical history. History reviewed. No pertinent past surgical history. No family history on file. History  Substance Use Topics  . Smoking status: Current Every Day Smoker  . Smokeless tobacco: Not on file  . Alcohol Use: No   OB History   Grav Para Term Preterm Abortions TAB SAB Ect Mult Living                 Review of Systems  Constitutional: Positive for fever.  HENT: Positive for ear pain and sore throat.   All other systems reviewed and are negative.    Allergies  Review of patient's allergies indicates no known allergies.  Home Medications  No current outpatient prescriptions on file.  BP 138/84  Pulse 84  Temp(Src) 97.9 F (36.6 C) (Oral)  Resp 16  Wt 180 lb (81.647 kg)  SpO2 97%  Physical Exam  Nursing note and vitals reviewed. Constitutional: She is oriented to person, place, and time. She appears well-developed and well-nourished. No distress.  HENT:  Head: Normocephalic and atraumatic.  Right Ear: There is tenderness. Tympanic membrane is injected.  Left Ear: Tympanic membrane and ear canal  normal.  Nose: Nose normal.  Mouth/Throat: Uvula is midline and mucous membranes are normal. No trismus in the jaw. No uvula swelling. Posterior oropharyngeal erythema present. No oropharyngeal exudate, posterior oropharyngeal edema or tonsillar abscesses.  Right OM; tonsils 1+ bilaterally without exudate; no difficulty swallowing; handling secretions appropriately; uvula midline  Eyes: Conjunctivae and EOM are normal. Pupils are equal, round, and reactive to light.  Neck: Normal range of motion. Neck supple.  Cardiovascular: Normal rate, regular rhythm and normal heart sounds.   Pulmonary/Chest: Effort normal and breath sounds normal. No respiratory distress. She has no wheezes.  Musculoskeletal: Normal range of motion.  Neurological: She is alert and oriented to person, place, and time.  Skin: Skin is warm and dry. She is not diaphoretic.  Psychiatric: She has a normal mood and affect.    ED Course  Procedures (including critical care time)  DIAGNOSTIC STUDIES: Oxygen Saturation is 97% on RA, normal by my interpretation.    COORDINATION OF CARE: 8:39 PM-Discussed treatment plan which includes an antibiotic with pt at bedside and pt agreed to plan.   Labs Review Labs Reviewed  RAPID STREP SCREEN   Imaging Review No results found.  EKG Interpretation   None       MDM   1. OM (otitis media), right    Right OM, rapid strep negative.  Will start on amoxcillin.  Advised to continue tylenol/motrin PRN fever.  FU with [facility] if problems occur.  Discussed plan with pt, she agreed.  Return precautions advised.  I personally performed the services described in this documentation, which was scribed in my presence. The recorded information has been reviewed and is accurate.   Garlon Hatchet, PA-C 04/13/13 2304

## 2013-04-13 NOTE — ED Notes (Signed)
Patient presents today with a chief complaint of right ear pain and sore throat x 2 weeks. Patient has been taking tylenol for fevers over past 3 days. Patient reports nothing makes pain better. Patient denies nausea/vomiting.

## 2013-04-16 LAB — CULTURE, GROUP A STREP

## 2014-11-17 ENCOUNTER — Emergency Department (HOSPITAL_COMMUNITY): Payer: Self-pay

## 2014-11-17 ENCOUNTER — Emergency Department (HOSPITAL_COMMUNITY)
Admission: EM | Admit: 2014-11-17 | Discharge: 2014-11-18 | Disposition: A | Discharge status: Home / Self Care | Payer: Self-pay | Attending: Emergency Medicine | Admitting: Emergency Medicine

## 2014-11-17 ENCOUNTER — Encounter (HOSPITAL_COMMUNITY): Payer: Self-pay | Admitting: Emergency Medicine

## 2014-11-17 DIAGNOSIS — Y9289 Other specified places as the place of occurrence of the external cause: Secondary | ICD-10-CM | POA: Insufficient documentation

## 2014-11-17 DIAGNOSIS — Y999 Unspecified external cause status: Secondary | ICD-10-CM | POA: Insufficient documentation

## 2014-11-17 DIAGNOSIS — S80212A Abrasion, left knee, initial encounter: Secondary | ICD-10-CM | POA: Insufficient documentation

## 2014-11-17 DIAGNOSIS — Z720 Tobacco use: Secondary | ICD-10-CM | POA: Insufficient documentation

## 2014-11-17 DIAGNOSIS — Y939 Activity, unspecified: Secondary | ICD-10-CM | POA: Insufficient documentation

## 2014-11-17 DIAGNOSIS — Z792 Long term (current) use of antibiotics: Secondary | ICD-10-CM | POA: Insufficient documentation

## 2014-11-17 DIAGNOSIS — W109XXA Fall (on) (from) unspecified stairs and steps, initial encounter: Secondary | ICD-10-CM | POA: Insufficient documentation

## 2014-11-17 DIAGNOSIS — S8992XA Unspecified injury of left lower leg, initial encounter: Secondary | ICD-10-CM | POA: Insufficient documentation

## 2014-11-17 NOTE — ED Notes (Signed)
Pt slipped and fell down 7 steps 1 hour ago.  C/o pain to L knee.  Denies LOC.  Denies neck and back pain.  Reports knee is the only place hurting.  CMS intact.

## 2014-11-18 MED ORDER — HYDROCODONE-ACETAMINOPHEN 5-325 MG PO TABS
2.0000 | ORAL_TABLET | Freq: Once | ORAL | Status: DC
  Filled 2014-11-18: qty 2

## 2014-11-18 MED ORDER — IBUPROFEN 800 MG PO TABS: 800.0000 mg | ORAL_TABLET | Freq: Three times a day (TID) | ORAL | Status: DC

## 2014-11-18 MED ORDER — HYDROCODONE-ACETAMINOPHEN 5-325 MG PO TABS: 1.0000 | ORAL_TABLET | Freq: Four times a day (QID) | ORAL | Status: DC | PRN

## 2014-11-18 NOTE — ED Notes (Signed)
Pt given instructions on how to use crutches and immobilizer.  Verbalized understanding.

## 2014-11-18 NOTE — ED Provider Notes (Signed)
CSN: [id number]     Arrival date & time 11/17/14  2313 History  This chart was scribed for Virginia Sanders, PA-C, working with Devoria Albe, MD by Chestine Spore, ED Scribe. The patient was seen in room TR05C/TR05C at 12:33 AM.    Chief Complaint  Patient presents with  . Fall  . Knee Pain      The history is provided by the patient. No language interpreter was used.    HPI Comments: Virginia Dyer is a 34 y.o. female who presents to the Emergency Department complaining of fall onset 1 hour ago. Pt reports that she fell down the stairs and twisted her leg and she is now having pain when trying to bear weight on her left leg. She states that she is having associated symptoms of left knee pain and abrasion.  She denies joint swelling, redness, and any other symptoms.  History reviewed. No pertinent past medical history. History reviewed. No pertinent past surgical history. No family history on file. History  Substance Use Topics  . Smoking status: Current Every Day Smoker  . Smokeless tobacco: Not on file  . Alcohol Use: No   OB History    No data available     Review of Systems  Musculoskeletal: Positive for arthralgias. Negative for joint swelling.  Skin: Positive for wound (abrasion). Negative for color change.    Allergies  Review of patient's allergies indicates no known allergies.  Home Medications   Prior to Admission medications   Medication Sig Start Date End Date Taking? Authorizing Provider  amoxicillin (AMOXIL) 500 MG capsule Take 1 capsule (500 mg total) by mouth 3 (three) times daily. 04/13/13   Garlon Hatchet, PA-C  HYDROcodone-acetaminophen (NORCO/VICODIN) 5-325 MG per tablet Take 1-2 tablets by mouth every 6 (six) hours as needed. 11/18/14   Ladona Mow, PA-C  ibuprofen (ADVIL,MOTRIN) 800 MG tablet Take 1 tablet (800 mg total) by mouth 3 (three) times daily. 11/18/14   Ladona Mow, PA-C   BP 137/94 mmHg  Pulse 98  Temp(Src) 98.4 F (36.9 C) (Oral)  Resp 22  SpO2  99% Physical Exam  Constitutional: She is oriented to person, place, and time. She appears well-developed and well-nourished. No distress.  HENT:  Head: Normocephalic and atraumatic.  Eyes: EOM are normal.  Neck: Neck supple. No tracheal deviation present.  Cardiovascular: Normal rate.   Pulses:      Dorsalis pedis pulses are 2+ on the left side.  Pulmonary/Chest: Effort normal. No respiratory distress.  Musculoskeletal: Normal range of motion.  Mild abrasion to the anterior portion of left knee. No anterior or posterior instability. No medial or lateral instability. + McMurray's test with valgus pressure. Distal sensation intact. Full passive and active ROM.  Neurological: She is alert and oriented to person, place, and time.  Skin: Skin is warm and dry.  Psychiatric: She has a normal mood and affect. Her behavior is normal.  Nursing note and vitals reviewed.   ED Course  Procedures (including critical care time) DIAGNOSTIC STUDIES: Oxygen Saturation is 97% on RA, nl by my interpretation.    COORDINATION OF CARE: 12:38 AM-Discussed treatment plan which includes left knee x-ray with pt at bedside and pt agreed to plan.   Labs Review Labs Reviewed - No data to display  Imaging Review Dg Knee Complete 4 Views Left  11/18/2014   CLINICAL DATA:  Lateral left knee pain and bruising after falling off a ladder tonight  EXAM: LEFT KNEE - COMPLETE 4+ VIEW  COMPARISON:  None.  FINDINGS: There is an ossific fragment adjacent to the lateral tibial plateau with appearances consistent with a Segond fracture. There is no other area of suspicion for acute fracture. There is no bone lesion or bony destruction. No acute soft tissue abnormality is evident.  IMPRESSION: Probable Segond fracture.   Electronically Signed   By: Ellery Plunk M.D.   On: 11/18/2014 00:37     EKG Interpretation None      MDM   Final diagnoses:  Knee injury, left, initial encounter    Patient with knee injury,  signs and symptoms consistent with internal derangement injury, possible anterior cruciate ligament tear. Radiographs are supportive of this. No instability noted on exam. Patient neurovascularly intact. Place patient in knee immobilizer, gave crutches. Encouraged RICE therapy and follow-up with orthopedics. Return precautions discussed, patient verbalizes understanding and agreement of this plan.  I personally performed the services described in this documentation, which was scribed in my presence. The recorded information has been reviewed and is accurate.  BP 137/94 mmHg  Pulse 98  Temp(Src) 98.4 F (36.9 C) (Oral)  Resp 22  SpO2 99%  Signed,  Ladona Mow, PA-C 6:59 PM   Ladona Mow, PA-C 11/18/14 1859  Devoria Albe, MD 11/22/14 2308

## 2014-11-18 NOTE — ED Notes (Signed)
Pt sts she is having trouble when she has to put pressure down on her leg.  It causes her leg intense pain to extend it.

## 2014-11-18 NOTE — Discharge Instructions (Signed)
Esguince de rodilla °( Knee Sprain) °Un esguince de rodilla es un desgarro en uno de los tejidos fuertes y fibrosos (ligamentos) que conectan los huesos de la rodilla. La gravedad del esguince depende de cuánto ligamento se rompe. La ruptura puede ser parcial o completa. °CAUSAS  °A menudo, los esguinces son el resultado de una caída o una lesión. La fuerza del impacto hace que las fibras del ligamento se estiren más de su largo normal. Este exceso de tensión es la causa de que las fibras del ligamento se rompan. °SIGNOS Y SÍNTOMAS  °Es posible que pierda el movimiento de la rodilla. Otros síntomas son: °· Moretones. °· Dolor en la zona de la rodilla. °· Sensibilidad de la rodilla al tacto. °· Hinchazón. °DIAGNÓSTICO  °Para diagnosticar un esguince de rodilla, su médico le hará un examen físico de la rodilla. Además, puede indicarle que se haga una radiografía de la rodilla para asegurarse de que no haya huesos fracturados. °TRATAMIENTO  °Si el ligamento está parcialmente roto, el tratamiento, habitualmente, consiste en mantener la rodilla en una posición fija (inmovilización) o en usar un soporte durante algunas semanas cuando realice actividades que requieran movimiento. Para ello, su médico colocará un vendaje, un yeso o una férula para impedir que la rodilla se mueva y para que le brinde apoyo durante los movimientos hasta que esta se cure. En el caso de un ligamento parcialmente roto, el proceso de curación generalmente demora de 4 a 6 semanas. °Si el ligamento está completamente roto, según de qué ligamento se trate, podrá necesitar una cirugía para volver a unirlo al hueso o para reconstruirlo. Después de la cirugía, le colocarán un yeso o una férula que no podrá quitarse durante 4 a 6 semanas mientras el ligamento se cura. °INSTRUCCIONES PARA EL CUIDADO EN EL HOGAR °· Mantenga elevada la rodilla lesionada para disminuir la hinchazón. °· Para aliviar el dolor y la hinchazón, aplique hielo en la zona de la  lesión: °¨ Ponga el hielo en una bolsa plástica. °¨ Colóquese una toalla entre la piel y la bolsa de hielo. °¨ Deje el hielo durante 20 minutos, 2 a 3 veces por día. °· Tome los analgésicos únicamente como le indicó su médico. °· No deje la rodilla sin protección hasta que el dolor y la rigidez desaparezcan (generalmente en el término de 4 a 6 semanas). °· Si tiene puesto un yeso o una férula, no deje que se moje. Si le han indicado que no puede quitárselo, cúbralo con una bolsa plástica para darse una ducha o un baño. No practique natación. °· Su médico puede indicarle ejercicios para que haga durante la recuperación, a fin de evitar o limitar la debilidad y la rigidez permanentes. °SOLICITE ATENCIÓN MÉDICA DE INMEDIATO SI: °· El yeso o la férula se dañan. °· El dolor empeora. °· Tiene dolor intenso, hinchazón o adormecimiento debajo del yeso o la férula. °ASEGÚRESE DE QUE: °· Comprende estas instrucciones. °· Controlará su afección. °· Recibirá ayuda de inmediato si no mejora o si empeora. °Document Released: 05/28/2005 Document Revised: 03/18/2013 °[employer]® Patient Information ©2015 [employer]. This information is not intended to replace advice given to you by your health care provider. Make sure you discuss any questions you have with your health care provider. ° °

## 2016-04-01 ENCOUNTER — Emergency Department (HOSPITAL_COMMUNITY)
Admission: EM | Admit: 2016-04-01 | Discharge: 2016-04-02 | Disposition: A | Discharge status: Home / Self Care | Payer: Self-pay | Attending: Emergency Medicine | Admitting: Emergency Medicine

## 2016-04-01 ENCOUNTER — Emergency Department (HOSPITAL_COMMUNITY): Payer: Self-pay

## 2016-04-01 ENCOUNTER — Encounter (HOSPITAL_COMMUNITY): Payer: Self-pay

## 2016-04-01 DIAGNOSIS — F17200 Nicotine dependence, unspecified, uncomplicated: Secondary | ICD-10-CM | POA: Insufficient documentation

## 2016-04-01 DIAGNOSIS — B9789 Other viral agents as the cause of diseases classified elsewhere: Secondary | ICD-10-CM

## 2016-04-01 DIAGNOSIS — J069 Acute upper respiratory infection, unspecified: Secondary | ICD-10-CM | POA: Insufficient documentation

## 2016-04-01 LAB — PREGNANCY, URINE: Preg Test, Ur: NEGATIVE

## 2016-04-01 NOTE — ED Triage Notes (Signed)
Husband and child has cough.

## 2016-04-01 NOTE — ED Triage Notes (Signed)
Onset 2 weeks productive cough- white phlegm.  Onset 3 days pt has had scratchy throat and when laying flat gets short of breath.  Talking in complete sentences.  Pt has tried OTC meds with no relief.

## 2016-04-01 NOTE — ED Provider Notes (Signed)
[facility]-EMERGENCY DEPT Provider Note   CSN: [id number] Arrival date & time: 04/01/16  4098  By signing my name below, I, Rosario Adie, attest that this documentation has been prepared under the direction and in the presence of Mattie Marlin, PA-C.  Electronically Signed: Rosario Adie, ED Scribe. 04/01/16. 11:45 PM.  History   Chief Complaint Chief Complaint  Patient presents with  . Cough   The history is provided by the patient. Language interpreter used: Family at bedside, Spanish.   HPI Comments: Virginia Dyer is a 35 y.o. female with no pertinent PMHx, who presents to the Emergency Department complaining of gradually worsening, persistent productive cough w/ white sputum over the past ~2 weeks. Pt reports associated nasal congestion and sore throat secondary to the onset of her cough. Pt additionally notes that she was experiencing left-sided ear pain ~1 week ago. However, she states that she took three doses of Penicillin from a prior infection for this issue with complete relief of her pain, but not her cough or other symptoms. She additionally has been taking Mucinex and Robitussin with minimal relief of her current symptoms. Her cough seems to be exacerbated with laying supine. Per pt, her husband and son have been sick with similar symptoms prior to the onset of her symptoms today. Denies sinus pressure, fever, vomiting, nausea, chest pain, rhinorrhea, or any other associated symptoms. Immunizations are not UTD.   History reviewed. No pertinent past medical history.  There are no active problems to display for this patient.  History reviewed. No pertinent surgical history.  OB History    No data available     Home Medications    Prior to Admission medications   Medication Sig Start Date End Date Taking? Authorizing Provider  amoxicillin (AMOXIL) 500 MG capsule Take 1 capsule (500 mg total) by mouth 3 (three) times daily. 04/13/13   Garlon Hatchet, PA-C    HYDROcodone-acetaminophen (NORCO/VICODIN) 5-325 MG per tablet Take 1-2 tablets by mouth every 6 (six) hours as needed. 11/18/14   Ladona Mow, PA-C  ibuprofen (ADVIL,MOTRIN) 800 MG tablet Take 1 tablet (800 mg total) by mouth 3 (three) times daily. 11/18/14   Ladona Mow, PA-C   Family History History reviewed. No pertinent family history.  Social History Social History  Substance Use Topics  . Smoking status: Current Every Day Smoker  . Smokeless tobacco: Never Used  . Alcohol use No   Allergies   Review of patient's allergies indicates no known allergies.  Review of Systems Review of Systems  Constitutional: Negative for fever.  HENT: Positive for congestion and sore throat. Negative for ear pain, rhinorrhea and sinus pressure.   Respiratory: Positive for cough.   Cardiovascular: Negative for chest pain.  Gastrointestinal: Negative for nausea and vomiting.   Physical Exam Updated Vital Signs BP 167/97 (BP Location: Left Arm)   Pulse 101   Temp 98.5 F (36.9 C) (Oral)   Resp 22   SpO2 99%   Physical Exam  Constitutional: She appears well-developed and well-nourished. No distress.  HENT:  Head: Normocephalic and atraumatic.  Right Ear: External ear and ear canal normal. Tympanic membrane is retracted. Tympanic membrane is not erythematous.  Left Ear: External ear and ear canal normal. Tympanic membrane is retracted. Tympanic membrane is not erythematous.  Nose: Mucosal edema and rhinorrhea present.  Mouth/Throat: Uvula is midline and mucous membranes are normal. No trismus in the jaw. No uvula swelling. Posterior oropharyngeal erythema present. No oropharyngeal exudate, posterior oropharyngeal edema or  tonsillar abscesses. Tonsils are 1+ on the right. Tonsils are 0 on the left. No tonsillar exudate.  Post-nasal drainage noted. Mucosal erythema noted.   Eyes: Conjunctivae are normal.  Neck: Normal range of motion. Neck supple.  Cardiovascular: Regular rhythm and normal heart  sounds.  Tachycardia present.   No murmur heard. Pulmonary/Chest: Effort normal and breath sounds normal. No respiratory distress. She has no wheezes. She has no rales.  Musculoskeletal: Normal range of motion.  Lymphadenopathy:    She has no cervical adenopathy.  Neurological: She is alert. Coordination normal.  Skin: Skin is warm and dry. She is not diaphoretic.  Psychiatric: She has a normal mood and affect. Her behavior is normal.  Nursing note and vitals reviewed.  ED Treatments / Results  DIAGNOSTIC STUDIES: Oxygen Saturation is 99% on RA, normal by my interpretation.   COORDINATION OF CARE: 11:45 PM-Discussed next steps with pt. Pt verbalized understanding and is agreeable with the plan.   Labs (all labs ordered are listed, but only abnormal results are displayed) Labs Reviewed  PREGNANCY, URINE   Radiology Dg Chest 2 View  Result Date: 04/02/2016 CLINICAL DATA:  Dry cough with itchy throat EXAM: CHEST  2 VIEW COMPARISON:  None. FINDINGS: The heart size and mediastinal contours are within normal limits. Both lungs are clear. The visualized skeletal structures are unremarkable. IMPRESSION: No active cardiopulmonary disease. Electronically Signed   By: Jasmine PangKim  Fujinaga M.D.   On: 04/02/2016 00:30    Procedures Procedures   Medications Ordered in ED Medications - No data to display  Initial Impression / Assessment and Plan / ED Course  I have reviewed the triage vital signs and the nursing notes.  Pertinent labs & imaging results that were available during my care of the patient were reviewed by me and considered in my medical decision making (see chart for details).  Clinical Course   Pt is a 35yo female presents with worsening cough with nasal congestion and sore throat over the past week. On exam, pt in NAD. VSS. Afebrile. Lungs CTA, Heart RRR.  Pt CXR negative for acute infiltrate. Patients symptoms are consistent with URI, likely viral etiology. Discussed that  antibiotics are not indicated for viral infections. Pt will be discharged with symptomatic treatment.  Verbalizes understanding and is agreeable with plan. Pt is hemodynamically stable & in NAD prior to dc. Instruct patient to follow up with PCP or return to the ED in 3 days if symptoms are not improving. Pt is comfortable with above plan and is stable for discharge at this time. All questions were answered prior to disposition. Strict return precautions for f/u into the ED were discussed.   Final Clinical Impressions(s) / ED Diagnoses   Final diagnoses:  Viral URI with cough   New Prescriptions Discharge Medication List as of 04/02/2016 12:44 AM     I personally performed the services described in this documentation, which was scribed in my presence. The recorded information has been reviewed and is accurate.       Jerre SimonJessica L Tulip Meharg, PA 04/02/16 0210    Marily MemosJason Mesner, MD 04/03/16 [phone]

## 2016-04-02 NOTE — Discharge Instructions (Signed)
Take Mucinex DM, Zyrtec at night and Afrin as prescribed on the packaging (generic is fine). Be sure to drink plenty of fluids. [facility] or here at the ED in 3 days symptoms are not improving. Return immediately to the emergency department if you experience fever, swelling of your throat, difficulty swallowing, difficulty breathing, chest pain, shortness of breath, vomiting or any other concerning symptoms.

## 2016-08-23 ENCOUNTER — Emergency Department (HOSPITAL_COMMUNITY)
Admission: EM | Admit: 2016-08-23 | Discharge: 2016-08-23 | Disposition: A | Discharge status: Home / Self Care | Payer: Self-pay | Attending: Emergency Medicine | Admitting: Emergency Medicine

## 2016-08-23 ENCOUNTER — Encounter (HOSPITAL_COMMUNITY): Payer: Self-pay

## 2016-08-23 DIAGNOSIS — R112 Nausea with vomiting, unspecified: Secondary | ICD-10-CM | POA: Insufficient documentation

## 2016-08-23 DIAGNOSIS — R51 Headache: Secondary | ICD-10-CM | POA: Insufficient documentation

## 2016-08-23 DIAGNOSIS — F17200 Nicotine dependence, unspecified, uncomplicated: Secondary | ICD-10-CM | POA: Insufficient documentation

## 2016-08-23 DIAGNOSIS — R110 Nausea: Secondary | ICD-10-CM

## 2016-08-23 DIAGNOSIS — R519 Headache, unspecified: Secondary | ICD-10-CM

## 2016-08-23 LAB — I-STAT BETA HCG BLOOD, ED (MC, WL, AP ONLY): I-stat hCG, quantitative: <5 m[IU]/mL (ref <5)

## 2016-08-23 MED ORDER — DIPHENHYDRAMINE HCL 50 MG/ML IJ SOLN
25.0000 mg | Freq: Once | INTRAMUSCULAR | Status: AC
  Administered 2016-08-23: 25 mg via INTRAVENOUS
  Filled 2016-08-23: qty 1

## 2016-08-23 MED ORDER — METOCLOPRAMIDE HCL 5 MG/ML IJ SOLN
10.0000 mg | Freq: Once | INTRAMUSCULAR | Status: AC
  Administered 2016-08-23: 10 mg via INTRAVENOUS
  Filled 2016-08-23: qty 2

## 2016-08-23 MED ORDER — SODIUM CHLORIDE 0.9 % IV BOLUS (SEPSIS)
1000.0000 mL | Freq: Once | INTRAVENOUS | Status: AC
  Administered 2016-08-23: 1000 mL via INTRAVENOUS

## 2016-08-23 NOTE — ED Triage Notes (Signed)
Pt reports she woke up this morning with a headache and she took tylenol without relief. She also reports she has had 5 episodes of vomiting.

## 2016-08-23 NOTE — ED Provider Notes (Signed)
[facility] Provider Note   CSN: [id number] Arrival date & time: 08/23/16  1703     History   Chief Complaint Chief Complaint  Patient presents with  . Headache  . Emesis    HPI Virginia Dyer is a 36 y.o. female.  Virginia Dyer is a 36 y.o. Female who presents to the emergency department complaining of a headache, nausea and vomiting since awaking this morning. Patient reports a generalized headache when she woke up this morning. She reports gradual onset of headache. She reports been having associated nausea and several episodes of vomiting today. She denies abdominal pain. She denies history of migraines. She denies sudden onset headache. She took Tylenol with little relief of her symptoms today. She denies fevers, neck pain, neck stiffness, double vision, abdominal pain, diarrhea, numbness, tingling, weakness, lightheadedness, dizziness, syncope, or rashes.   The history is provided by the patient and medical records. No language interpreter was used.  Headache   Associated symptoms include nausea and vomiting. Pertinent negatives include no fever and no shortness of breath.  Emesis   Associated symptoms include headaches. Pertinent negatives include no abdominal pain, no chills, no cough, no diarrhea and no fever.    History reviewed. No pertinent past medical history.  There are no active problems to display for this patient.   History reviewed. No pertinent surgical history.  OB History    No data available       Home Medications    Prior to Admission medications   Medication Sig Start Date End Date Taking? Authorizing Provider  amoxicillin (AMOXIL) 500 MG capsule Take 1 capsule (500 mg total) by mouth 3 (three) times daily. 04/13/13   Garlon Hatchet, PA-C  HYDROcodone-acetaminophen (NORCO/VICODIN) 5-325 MG per tablet Take 1-2 tablets by mouth every 6 (six) hours as needed. 11/18/14   Ladona Mow, PA-C  ibuprofen (ADVIL,MOTRIN) 800 MG tablet  Take 1 tablet (800 mg total) by mouth 3 (three) times daily. 11/18/14   Ladona Mow, PA-C    Family History No family history on file.  Social History Social History  Substance Use Topics  . Smoking status: Current Every Day Smoker  . Smokeless tobacco: Never Used  . Alcohol use No     Allergies   Patient has no known allergies.   Review of Systems Review of Systems  Constitutional: Negative for chills and fever.  HENT: Negative for congestion, ear pain, facial swelling, hearing loss and sore throat.   Eyes: Negative for pain and visual disturbance.  Respiratory: Negative for cough and shortness of breath.   Cardiovascular: Negative for chest pain.  Gastrointestinal: Positive for nausea and vomiting. Negative for abdominal pain and diarrhea.  Genitourinary: Negative for dysuria.  Musculoskeletal: Negative for back pain and neck pain.  Skin: Negative for rash.  Neurological: Positive for headaches. Negative for dizziness, syncope, weakness, light-headedness and numbness.     Physical Exam Updated Vital Signs BP 120/75   Pulse 87   Temp 97.8 F (36.6 C) (Oral)   Resp 18   Ht 5\' 5"  (1.651 m)   Wt 78 kg   SpO2 97%   BMI 28.62 kg/m   Physical Exam  Constitutional: She is oriented to person, place, and time. She appears well-developed and well-nourished. No distress.  HENT:  Head: Normocephalic and atraumatic.  Right Ear: External ear normal.  Left Ear: External ear normal.  Mouth/Throat: Oropharynx is clear and moist.  Bilateral tympanic membranes are pearly-gray without erythema or loss of landmarks.  No temporal edema or tenderness.  Eyes: Conjunctivae and EOM are normal. Pupils are equal, round, and reactive to light. Right eye exhibits no discharge. Left eye exhibits no discharge.  Neck: Normal range of motion. Neck supple. No JVD present. No tracheal deviation present.  Cardiovascular: Normal rate, regular rhythm, normal heart sounds and intact distal pulses.   Exam reveals no gallop and no friction rub.   No murmur heard. Pulmonary/Chest: Effort normal and breath sounds normal. No stridor. No respiratory distress. She has no wheezes. She has no rales.  Abdominal: Soft. There is no tenderness. There is no guarding.  Abdomen soft and nontender to palpation.  Musculoskeletal: Normal range of motion. She exhibits no edema or tenderness.  Lymphadenopathy:    She has no cervical adenopathy.  Neurological: She is alert and oriented to person, place, and time. No cranial nerve deficit or sensory deficit. She exhibits normal muscle tone. Coordination normal.  Patient is alert and oriented 3. Cranial nerves are intact. Speech is clear and coherent. No pronator drift. Finger-to-nose intact bilaterally. EOMs are intact. Vision is grossly intact. Normal gait. Sensation is intact in her bilateral upper and lower extremity.  Skin: Skin is warm and dry. Capillary refill takes less than 2 seconds. No rash noted. She is not diaphoretic. No erythema. No pallor.  Psychiatric: She has a normal mood and affect. Her behavior is normal.  Nursing note and vitals reviewed.    ED Treatments / Results  Labs (all labs ordered are listed, but only abnormal results are displayed) Labs Reviewed  I-STAT BETA HCG BLOOD, ED ([facility], [facility], [facility] ONLY)    EKG  EKG Interpretation None       Radiology No results found.  Procedures Procedures (including critical care time)  Medications Ordered in ED Medications  sodium chloride 0.9 % bolus 1,000 mL (0 mLs Intravenous Stopped 08/23/16 2153)  metoCLOPramide (REGLAN) injection 10 mg (10 mg Intravenous Given 08/23/16 2104)  diphenhydrAMINE (BENADRYL) injection 25 mg (25 mg Intravenous Given 08/23/16 2103)     Initial Impression / Assessment and Plan / ED Course  I have reviewed the triage vital signs and the nursing notes.  Pertinent labs & imaging results that were available during my care of the patient were reviewed by me and  considered in my medical decision making (see chart for details).    This  is a 36 y.o. Female who presents to the emergency department complaining of a headache, nausea and vomiting since awaking this morning. Patient reports a generalized headache when she woke up this morning. She reports gradual onset of headache. Pt HA treated and resolved while in ED. Prior to discharge patient is tolerating by mouth without vomiting.  Presentation is like pts typical HA and non concerning [facility], ICH, Meningitis, or temporal arteritis. Pt is afebrile with no focal neuro deficits, nuchal rigidity, or change in vision. Pt is to follow up with PCP to discuss prophylactic medication. I advised the patient to follow-up with their primary care provider this week. I advised the patient to return to the emergency department with new or worsening symptoms or new concerns. The patient verbalized understanding and agreement with plan.     Final Clinical Impressions(s) / ED Diagnoses   Final diagnoses:  Bad headache  Nausea    New Prescriptions New Prescriptions   No medications on file     Everlene FarrierWilliam Maricia Scotti, PA-C 08/23/16 2220    Maia PlanJoshua G Long, MD 08/24/16 [phone]

## 2017-03-16 ENCOUNTER — Emergency Department (HOSPITAL_COMMUNITY)
Admission: EM | Admit: 2017-03-16 | Discharge: 2017-03-16 | Disposition: A | Discharge status: Home / Self Care | Payer: Self-pay | Attending: Physician Assistant | Admitting: Physician Assistant

## 2017-03-16 ENCOUNTER — Encounter (HOSPITAL_COMMUNITY): Payer: Self-pay | Admitting: *Deleted

## 2017-03-16 DIAGNOSIS — H04129 Dry eye syndrome of unspecified lacrimal gland: Secondary | ICD-10-CM

## 2017-03-16 DIAGNOSIS — H04123 Dry eye syndrome of bilateral lacrimal glands: Secondary | ICD-10-CM | POA: Insufficient documentation

## 2017-03-16 DIAGNOSIS — F17200 Nicotine dependence, unspecified, uncomplicated: Secondary | ICD-10-CM | POA: Insufficient documentation

## 2017-03-16 MED ORDER — KETOTIFEN FUMARATE 0.025 % OP SOLN
1.0000 [drp] | Freq: Once | OPHTHALMIC | Status: AC
  Administered 2017-03-16: 1 [drp] via OPHTHALMIC
  Filled 2017-03-16: qty 5

## 2017-03-16 NOTE — ED Provider Notes (Signed)
[facility] Provider Note   CSN: [id number] Arrival date & time: 03/16/17  1704     History   Chief Complaint Chief Complaint  Patient presents with  . Eye Pain    HPI Elvin Banker is a 36 y.o. female.  HPI   A pleasant 36 year old female presenting with dry eyes. Patient reports that she works up in the morning and has very dry eyes that she's unable to see very well until she is able to get them wet and then aftera couple minutes she is able to see again.She denies any discharge. She does have mild R ear pain. No nasal congestion. Patient does not humidifier.    History reviewed. No pertinent past medical history.  There are no active problems to display for this patient.   History reviewed. No pertinent surgical history.  OB History    No data available       Home Medications    Prior to Admission medications   Medication Sig Start Date End Date Taking? Authorizing Provider  amoxicillin (AMOXIL) 500 MG capsule Take 1 capsule (500 mg total) by mouth 3 (three) times daily. 04/13/13   Garlon Hatchet, PA-C  HYDROcodone-acetaminophen (NORCO/VICODIN) 5-325 MG per tablet Take 1-2 tablets by mouth every 6 (six) hours as needed. 11/18/14   Ladona Mow, PA-C  ibuprofen (ADVIL,MOTRIN) 800 MG tablet Take 1 tablet (800 mg total) by mouth 3 (three) times daily. 11/18/14   Ladona Mow, PA-C    Family History No family history on file.  Social History Social History  Substance Use Topics  . Smoking status: Current Every Day Smoker  . Smokeless tobacco: Never Used  . Alcohol use No     Allergies   Patient has no known allergies.   Review of Systems Review of Systems  Constitutional: Negative for activity change.  HENT: Negative for ear discharge.   Eyes: Positive for itching. Negative for photophobia, discharge and redness.  Respiratory: Negative for shortness of breath.   Cardiovascular: Negative for chest pain.  Gastrointestinal: Negative for  abdominal pain.  All other systems reviewed and are negative.    Physical Exam Updated Vital Signs BP 127/90 (BP Location: Left Arm)   Pulse 98   Temp 97.9 F (36.6 C) (Oral)   Resp 16   Ht  (1.549 m)   Wt 83.9 kg (185 lb)   SpO2 98%   BMI 34.96 kg/m   Physical Exam  Constitutional: She is oriented to person, place, and time. She appears well-developed and well-nourished.  HENT:  Head: Normocephalic and atraumatic.  Eyes: Pupils are equal, round, and reactive to light. Conjunctivae and EOM are normal. Right eye exhibits no discharge. Left eye exhibits no discharge. No scleral icterus.  Normal eyes and vision  Cardiovascular: Normal rate, regular rhythm and normal heart sounds.   No murmur heard. Pulmonary/Chest: Effort normal and breath sounds normal. She has no wheezes. She has no rales.  Neurological: She is oriented to person, place, and time.  Skin: Skin is warm and dry. She is not diaphoretic.  Psychiatric: She has a normal mood and affect.  Nursing note and vitals reviewed.    ED Treatments / Results  Labs (all labs ordered are listed, but only abnormal results are displayed) Labs Reviewed - No data to display  EKG  EKG Interpretation None       Radiology No results found.  Procedures Procedures (including critical care time)  Medications Ordered in ED Medications  ketotifen (ZADITOR) 0.025 %  ophthalmic solution 1 drop (not administered)     Initial Impression / Assessment and Plan / ED Course  I have reviewed the triage vital signs and the nursing notes.  Pertinent labs & imaging results that were available during my care of the patient were reviewed by me and considered in my medical decision making (see chart for details).     Patient's pleasant 76 old female   A pleasant 36 year old female presenting with dry eyes. Patient reports that she works up in the morning and has very dry eyes that she's unable to see very well until she is able  to get them wet and then aftera couple minutes she is able to see again.She denies any discharge. She does have mild R ear pain. No nasal congestion. Patient does not humidifier.   9:10 PM We'll give th elubrication for eyes, follow-up with ophthalmology. I think this likely allergic in nature.   Final Clinical Impressions(s) / ED Diagnoses   Final diagnoses:  None    New Prescriptions New Prescriptions   No medications on file     Abelino Derrick, MD 03/16/17 2110

## 2017-03-16 NOTE — Discharge Instructions (Signed)
Return as needed. Please follow-up with ophthalmology as needed we think this is likely due to secondary to having dry eyes. Use the humidifier and eyedrops as needed.

## 2017-03-16 NOTE — ED Triage Notes (Signed)
The pt is c/o both eyes and both ears hurting for one week  No cough

## 2018-02-23 ENCOUNTER — Emergency Department (HOSPITAL_COMMUNITY): Payer: Self-pay

## 2018-02-23 ENCOUNTER — Other Ambulatory Visit: Payer: Self-pay

## 2018-02-23 ENCOUNTER — Emergency Department (HOSPITAL_COMMUNITY)
Admission: EM | Admit: 2018-02-23 | Discharge: 2018-02-24 | Disposition: A | Discharge status: Home / Self Care | Payer: Self-pay | Attending: Emergency Medicine | Admitting: Emergency Medicine

## 2018-02-23 DIAGNOSIS — M545 Low back pain, unspecified: Secondary | ICD-10-CM

## 2018-02-23 DIAGNOSIS — Y939 Activity, unspecified: Secondary | ICD-10-CM | POA: Insufficient documentation

## 2018-02-23 DIAGNOSIS — F17210 Nicotine dependence, cigarettes, uncomplicated: Secondary | ICD-10-CM | POA: Insufficient documentation

## 2018-02-23 DIAGNOSIS — W19XXXA Unspecified fall, initial encounter: Secondary | ICD-10-CM | POA: Insufficient documentation

## 2018-02-23 DIAGNOSIS — Z79899 Other long term (current) drug therapy: Secondary | ICD-10-CM | POA: Insufficient documentation

## 2018-02-23 DIAGNOSIS — Y92512 Supermarket, store or market as the place of occurrence of the external cause: Secondary | ICD-10-CM | POA: Insufficient documentation

## 2018-02-23 DIAGNOSIS — S8011XA Contusion of right lower leg, initial encounter: Secondary | ICD-10-CM | POA: Insufficient documentation

## 2018-02-23 DIAGNOSIS — S9002XA Contusion of left ankle, initial encounter: Secondary | ICD-10-CM | POA: Insufficient documentation

## 2018-02-23 DIAGNOSIS — Y999 Unspecified external cause status: Secondary | ICD-10-CM | POA: Insufficient documentation

## 2018-02-23 MED ORDER — METHOCARBAMOL 500 MG PO TABS: 500.0000 mg | ORAL_TABLET | Freq: Two times a day (BID) | ORAL | 0 refills | Status: DC

## 2018-02-23 MED ORDER — DICLOFENAC SODIUM 75 MG PO TBEC: 75.0000 mg | DELAYED_RELEASE_TABLET | Freq: Two times a day (BID) | ORAL | 0 refills | Status: DC

## 2018-02-23 NOTE — ED Triage Notes (Signed)
[facility] and [facility] that she slipped while pushing a cart and her legs spread far apart, right leg hit the shopping cart and left knee hit the floor. Now she is also having back pain.

## 2018-02-24 NOTE — ED Provider Notes (Signed)
[facility]   CSN: [id number] Arrival date & time: 02/23/18  2209     History   Chief Complaint Chief Complaint  Patient presents with  . Back Pain    HPI Virginia Dyer is a 37 y.o. female.  The history is provided by the patient. No language interpreter was used.  Back Pain   This is a new problem. The current episode started 3 to 5 hours ago. The problem occurs constantly. The problem has been gradually worsening. The pain is associated with falling. The pain is present in the lumbar spine. The quality of the pain is described as aching. The pain is moderate. The symptoms are aggravated by twisting. She has tried nothing for the symptoms. The treatment provided no relief.   Pt reports she fell at [employer].  Pt hit left knee on the floor and hit right shin on the bottom of a grocery cart. Pt reports she twisted her low back No past medical history on file.  There are no active problems to display for this patient.   No past surgical history on file.   OB History   None      Home Medications    Prior to Admission medications   Medication Sig Start Date End Date Taking? Authorizing Provider  amoxicillin (AMOXIL) 500 MG capsule Take 1 capsule (500 mg total) by mouth 3 (three) times daily. 04/13/13   Garlon HatchetSanders, Lisa M, PA-C  diclofenac (VOLTAREN) 75 MG EC tablet Take 1 tablet (75 mg total) by mouth 2 (two) times daily. 02/23/18   Elson AreasSofia, Corran Lalone K, PA-C  HYDROcodone-acetaminophen (NORCO/VICODIN) 5-325 MG per tablet Take 1-2 tablets by mouth every 6 (six) hours as needed. 11/18/14   Ladona MowMintz, Joe, PA-C  ibuprofen (ADVIL,MOTRIN) 800 MG tablet Take 1 tablet (800 mg total) by mouth 3 (three) times daily. 11/18/14   Ladona MowMintz, Joe, PA-C  methocarbamol (ROBAXIN) 500 MG tablet Take 1 tablet (500 mg total) by mouth 2 (two) times daily. 02/23/18   Elson AreasSofia, Lucca Greggs K, PA-C    Family History No family history on file.  Social  History Social History   Tobacco Use  . Smoking status: Current Every Day Smoker  . Smokeless tobacco: Never Used  Substance Use Topics  . Alcohol use: No  . Drug use: No     Allergies   Patient has no known allergies.   Review of Systems Review of Systems  Musculoskeletal: Positive for back pain.  All other systems reviewed and are negative.    Physical Exam Updated Vital Signs BP 136/89   Pulse 86   Temp 98.1 F (36.7 C)   Resp 16   Wt 77.1 kg   SpO2 99%   BMI 32.12 kg/m   Physical Exam  Constitutional: She appears well-developed and well-nourished.  Cardiovascular: Normal rate.  Pulmonary/Chest: Effort normal.  Musculoskeletal: She exhibits tenderness.  Left shin 10 cm area of bruising.  Right knee, bruised tender area.   Lumbar spine diffusely tender to palpation  No spinal tenderness   Neurological: She is alert.  Skin: Skin is warm.  Psychiatric: She has a normal mood and affect.  Nursing note and vitals reviewed.    ED Treatments / Results  Labs (all labs ordered are listed, but only abnormal results are displayed) Labs Reviewed - No data to display  EKG None  Radiology Dg Tibia/fibula Right  Result Date: 02/23/2018 CLINICAL DATA:  Fall with pain EXAM: RIGHT TIBIA AND FIBULA - 2  VIEW COMPARISON:  None. FINDINGS: There is no evidence of fracture or other focal bone lesions. Soft tissues are unremarkable. IMPRESSION: Negative. Electronically Signed   By: Jasmine Pang M.D.   On: 02/23/2018 22:47   Dg Knee Complete 4 Views Left  Result Date: 02/23/2018 CLINICAL DATA:  Fall with pain EXAM: LEFT KNEE - COMPLETE 4+ VIEW COMPARISON:  11/17/2014 FINDINGS: No acute displaced fracture or malalignment. No significant knee effusion. IMPRESSION: No acute osseous abnormality. Electronically Signed   By: Jasmine Pang M.D.   On: 02/23/2018 22:53    Procedures Procedures (including critical care time)  Medications Ordered in ED Medications - No data to  display   Initial Impression / Assessment and Plan / ED Course  I have reviewed the triage vital signs and the nursing notes.  Pertinent labs & imaging results that were available during my care of the patient were reviewed by me and considered in my medical decision making (see chart for details).     MDM  xrays show no evidence of fracture.  I doubt fracture of spine ans injury was twisting no impact.  Pt fell forward onto knee.   I will treat pt with voltaren and robaxin.  Pt advised ice and rest.  Follow up with Dr. Jena Gauss if symptoms persist   Final Clinical Impressions(s) / ED Diagnoses   Final diagnoses:  Acute low back pain without sciatica, unspecified back pain laterality  Contusion of left ankle, initial encounter  Contusion of right lower leg, initial encounter    ED Discharge Orders         Ordered    diclofenac (VOLTAREN) 75 MG EC tablet  2 times daily     02/23/18 2353    methocarbamol (ROBAXIN) 500 MG tablet  2 times daily     02/23/18 2353        An After Visit Summary was printed and given to the patient.    Elson Areas, PA-C 02/24/18 0006    Cathren Laine, MD 02/26/18 [phone]

## 2019-02-22 DIAGNOSIS — R1030 Lower abdominal pain, unspecified: Secondary | ICD-10-CM | POA: Diagnosis present

## 2019-02-28 ENCOUNTER — Emergency Department (HOSPITAL_COMMUNITY): Payer: Self-pay

## 2019-02-28 ENCOUNTER — Emergency Department (HOSPITAL_COMMUNITY)
Admission: EM | Admit: 2019-02-28 | Discharge: 2019-02-28 | Disposition: A | Discharge status: Home / Self Care | Payer: Self-pay | Attending: Emergency Medicine | Admitting: Emergency Medicine

## 2019-02-28 ENCOUNTER — Encounter (HOSPITAL_COMMUNITY): Payer: Self-pay

## 2019-02-28 ENCOUNTER — Other Ambulatory Visit: Payer: Self-pay

## 2019-02-28 DIAGNOSIS — I10 Essential (primary) hypertension: Secondary | ICD-10-CM | POA: Insufficient documentation

## 2019-02-28 DIAGNOSIS — R1030 Lower abdominal pain, unspecified: Secondary | ICD-10-CM | POA: Diagnosis present

## 2019-02-28 DIAGNOSIS — K429 Umbilical hernia without obstruction or gangrene: Secondary | ICD-10-CM | POA: Insufficient documentation

## 2019-02-28 DIAGNOSIS — N8312 Corpus luteum cyst of left ovary: Secondary | ICD-10-CM | POA: Insufficient documentation

## 2019-02-28 DIAGNOSIS — N83202 Unspecified ovarian cyst, left side: Secondary | ICD-10-CM

## 2019-02-28 DIAGNOSIS — F17210 Nicotine dependence, cigarettes, uncomplicated: Secondary | ICD-10-CM | POA: Insufficient documentation

## 2019-02-28 HISTORY — DX: Essential (primary) hypertension: I10

## 2019-02-28 LAB — COMPREHENSIVE METABOLIC PANEL
ALT: 40 U/L (ref 0–44)
AST: 33 U/L (ref 15–41)
Albumin: 3.6 g/dL (ref 3.5–5.0)
Alkaline Phosphatase: 56 U/L (ref 38–126)
Anion gap: 9 (ref 5–15)
BUN: 14 mg/dL (ref 6–20)
CO2: 24 mmol/L (ref 22–32)
Calcium: 9 mg/dL (ref 8.9–10.3)
Chloride: 103 mmol/L (ref 98–111)
Creatinine, Ser: 0.54 mg/dL (ref 0.44–1.00)
GFR calc Af Amer: >60 mL/min (ref >60)
GFR calc non Af Amer: >60 mL/min (ref >60)
Glucose, Bld: 133 mg/dL — ABNORMAL HIGH (ref 70–99)
Potassium: 3.7 mmol/L (ref 3.5–5.1)
Sodium: 136 mmol/L (ref 135–145)
Total Bilirubin: 0.4 mg/dL (ref 0.3–1.2)
Total Protein: 6.8 g/dL (ref 6.5–8.1)

## 2019-02-28 LAB — URINALYSIS, ROUTINE W REFLEX MICROSCOPIC
Bilirubin Urine: NEGATIVE
Glucose, UA: NEGATIVE mg/dL
Hgb urine dipstick: NEGATIVE
Ketones, ur: NEGATIVE mg/dL
Leukocytes,Ua: NEGATIVE
Nitrite: NEGATIVE
Protein, ur: NEGATIVE mg/dL
Specific Gravity, Urine: 1.006 (ref 1.005–1.030)
pH: 7 (ref 5.0–8.0)

## 2019-02-28 LAB — CBC
HCT: 40.4 % (ref 36.0–46.0)
Hemoglobin: 13.4 g/dL (ref 12.0–15.0)
MCH: 31.6 pg (ref 26.0–34.0)
MCHC: 33.2 g/dL (ref 30.0–36.0)
MCV: 95.3 fL (ref 80.0–100.0)
Platelets: 205 10*3/uL (ref 150–400)
RBC: 4.24 MIL/uL (ref 3.87–5.11)
RDW: 13.2 % (ref 11.5–15.5)
WBC: 10.5 10*3/uL (ref 4.0–10.5)
nRBC: 0 % (ref 0.0–0.2)

## 2019-02-28 LAB — I-STAT BETA HCG BLOOD, ED (MC, WL, AP ONLY): I-stat hCG, quantitative: <5 m[IU]/mL (ref <5)

## 2019-02-28 LAB — LIPASE, BLOOD: Lipase: 30 U/L (ref 11–51)

## 2019-02-28 MED ORDER — IOHEXOL 300 MG/ML  SOLN
100.0000 mL | Freq: Once | INTRAMUSCULAR | Status: AC | PRN
  Administered 2019-02-28: 100 mL via INTRAVENOUS

## 2019-02-28 MED ORDER — IBUPROFEN 200 MG PO TABS
200.0000 mg | ORAL_TABLET | Freq: Once | ORAL | Status: AC
  Administered 2019-02-28: 21:00:00 200 mg via ORAL
  Filled 2019-02-28: qty 1

## 2019-02-28 MED ORDER — ACETAMINOPHEN 325 MG PO TABS
650.0000 mg | ORAL_TABLET | Freq: Once | ORAL | Status: AC
  Administered 2019-02-28: 650 mg via ORAL
  Filled 2019-02-28: qty 2

## 2019-02-28 NOTE — ED Notes (Signed)
Pt to CT

## 2019-02-28 NOTE — ED Provider Notes (Signed)
[facility] Provider Note   CSN: [id number] Arrival date & time: 02/28/19  1556   History   Chief Complaint Chief Complaint  Patient presents with   Abdominal Pain    HPI Virginia Dyer is a very pleasant 38 y.o. female with history of hypertension (well controlled without medication) presenting with lower abdominal pain since 1 week.  The pain will suddenly start and last about 1 hour, then will go away for couple more hours.  Lifting heavy objects and extensive walking make the pain worse, resting makes the pain less severe.  Patient has also tried Tylenol and ibuprofen to improve the pain, but these have not been particularly helpful.  Patient states "it feels like it is my ovaries".  Her last bowel movement was this morning, it was normal, denies any changes in pain with bowel movements or eating.  She is on Depo-Provera for birth control, does not have periods while on this medicine.  She denies any fevers, chest pain, shortness of breath, myalgias, chills, nausea, vomiting, constipation, diarrhea, blood in her stools, or dark stools.   Past Medical History:  Diagnosis Date   Hypertension     Patient Active Problem List   Diagnosis Date Noted   Hypertension 02/28/2019   Lower abdominal pain of unknown etiology 02/22/2019    History reviewed. No pertinent surgical history.   OB History   No obstetric history on file.     Home Medications    Prior to Admission medications   Medication Sig Start Date End Date Taking? Authorizing Provider  amoxicillin (AMOXIL) 500 MG capsule Take 1 capsule (500 mg total) by mouth 3 (three) times daily. 04/13/13   Garlon HatchetSanders, Lisa M, PA-C  diclofenac (VOLTAREN) 75 MG EC tablet Take 1 tablet (75 mg total) by mouth 2 (two) times daily. 02/23/18   Elson AreasSofia, Leslie K, PA-C  HYDROcodone-acetaminophen (NORCO/VICODIN) 5-325 MG per tablet Take 1-2 tablets by mouth every 6 (six) hours as needed. 11/18/14    Ladona MowMintz, Joe, PA-C  ibuprofen (ADVIL,MOTRIN) 800 MG tablet Take 1 tablet (800 mg total) by mouth 3 (three) times daily. 11/18/14   Ladona MowMintz, Joe, PA-C  methocarbamol (ROBAXIN) 500 MG tablet Take 1 tablet (500 mg total) by mouth 2 (two) times daily. 02/23/18   Elson AreasSofia, Leslie K, PA-C   Family History History reviewed. No pertinent family history.  Social History Social History   Tobacco Use   Smoking status: Current Every Day Smoker   Smokeless tobacco: Never Used  Substance Use Topics   Alcohol use: No   Drug use: No    Allergies   Patient has no known allergies.   Review of Systems Review of Systems - see HPI   Physical Exam Updated Vital Signs BP 135/89 (BP Location: Left Arm)    Pulse 89    Temp 99.1 F (37.3 C) (Oral)    Resp 18    SpO2 99%   Physical Exam Constitutional:      Appearance: She is well-developed. She is obese. She is not toxic-appearing.  Cardiovascular:     Rate and Rhythm: Normal rate and regular rhythm.     Heart sounds: Normal heart sounds. No murmur.  Abdominal:     General: Bowel sounds are normal. There is no distension. There are no signs of injury.     Palpations: Abdomen is soft. There is no hepatomegaly or mass.     Tenderness: There is abdominal tenderness in the periumbilical area and suprapubic area. There  is no right CVA tenderness, left CVA tenderness or rebound. Negative signs include Murphy's sign, Rovsing's sign and McBurney's sign.     Hernia: A hernia is present. Hernia is present in the umbilical area.  Skin:    Capillary Refill: Capillary refill takes less than 2 seconds.  Neurological:     General: No focal deficit present.     Mental Status: She is alert and oriented to person, place, and time.  Psychiatric:        Mood and Affect: Mood normal.    ED Treatments / Results  Labs (all labs ordered are listed, but only abnormal results are displayed) Labs Reviewed  COMPREHENSIVE METABOLIC PANEL - Abnormal; Notable for the  following components:      Result Value   Glucose, Bld 133 (*)    All other components within normal limits  URINALYSIS, ROUTINE W REFLEX MICROSCOPIC - Abnormal; Notable for the following components:   Color, Urine STRAW (*)    All other components within normal limits  LIPASE, BLOOD  CBC  I-STAT BETA HCG BLOOD, ED ([facility], [facility], [facility] ONLY)    EKG None  Radiology Ct Abdomen Pelvis W Contrast  Result Date: 02/28/2019 CLINICAL DATA:  Pelvic pain EXAM: CT ABDOMEN AND PELVIS WITH CONTRAST TECHNIQUE: Multidetector CT imaging of the abdomen and pelvis was performed using the standard protocol following bolus administration of intravenous contrast. CONTRAST:  OMNIPAQUE IOHEXOL 300 MG/ML  SOLN COMPARISON:  July 08, 2007. FINDINGS: Lower chest: The lung bases are clear. The heart size is normal. Hepatobiliary: The liver is normal. Normal gallbladder.There is no biliary ductal dilation. Pancreas: Normal contours without ductal dilatation. No peripancreatic fluid collection. Spleen: No splenic laceration or hematoma. Adrenals/Urinary Tract: --Adrenal glands: No adrenal hemorrhage. --Right kidney/ureter: No hydronephrosis or perinephric hematoma. --Left kidney/ureter: No hydronephrosis or perinephric hematoma. --Urinary bladder: Unremarkable. Stomach/Bowel: --Stomach/Duodenum: No hiatal hernia or other gastric abnormality. Normal duodenal course and caliber. --Small bowel: No dilatation or inflammation. --Colon: No focal abnormality. --Appendix: Normal. Vascular/Lymphatic: Normal course and caliber of the major abdominal vessels. --No retroperitoneal lymphadenopathy. --No mesenteric lymphadenopathy. --No pelvic or inguinal lymphadenopathy. Reproductive: There is a simple appearing 3.4 cm left ovarian cyst. Other: No ascites or free air. There is a small fat containing periumbilical hernia. Musculoskeletal. No acute displaced fractures. IMPRESSION: 1. No acute abdominopelvic abnormality. 2. Simple appearing  3.4 cm left ovarian cyst. No specific follow-up is required for this finding 3. Small fat containing periumbilical hernia. Electronically Signed   By: Katherine Mantle M.D.   On: 02/28/2019 19:12    Procedures Procedures (including critical care time)  Medications Ordered in ED Medications  iohexol (OMNIPAQUE) 300 MG/ML solution 100 mL (100 mLs Intravenous Contrast Given 02/28/19 1852)  acetaminophen (TYLENOL) tablet 650 mg (650 mg Oral Given 02/28/19 2114)  ibuprofen (ADVIL) tablet 200 mg (200 mg Oral Given 02/28/19 2114)     Initial Impression / Assessment and Plan / ED Course  I have reviewed the triage vital signs and the nursing notes.  Pertinent labs & imaging results that were available during my care of the patient were reviewed by me and considered in my medical decision making (see chart for details).  Abdominal pain: Patient with new onset lower abdominal pain since 1 week.  CT showing left ovarian cyst and periumbilical hernia as probable cause of patient's abdominal pain.  Urinalysis within normal limits, pregnancy test negative, lipase within normal limits, CBC and CMP normal.  Vital signs stable. -Patient reassured, plan to  treat pain with Tylenol/ibuprofen and warm/hot compresses as needed. -Patient given information for follow-up -Strict return precautions given including nausea, vomiting, inability to keep foods or drink down, weight loss, bright red blood in stools, or black tarry stools.  Final Clinical Impressions(s) / ED Diagnoses   Final diagnoses:  Periumbilical hernia  Cyst of left ovary    ED Discharge Orders    None     Milus Banister, [place], PGY-2 02/28/2019 9:26 PM    Daisy Floro, DO 02/28/19 2138    Elnora Morrison, MD 03/02/19 [phone]

## 2019-02-28 NOTE — ED Notes (Signed)
Patient verbalizes understanding of discharge instructions. Opportunity for questioning and answers were provided. pt discharged from ED with son. Ambulatory by self.

## 2019-02-28 NOTE — Discharge Instructions (Signed)
It was a pleasure to take care of you today!  On your abdominal CT scan today a periumbilical hernia and left ovarian cyst were found which are causing your abdominal pain.   - Take Tylenol 500 mg with ibuprofen 200 mg every 6-8 hours as needed for abdominal pain. - You can also use cold or warm compresses to the abdomen to help control pain. - Please call the clinic to establish care and to set up an appointment for follow-up of your abdominal pain and high blood pressure.  Milus Banister, [place], PGY-2 02/28/2019 9:09 PM

## 2019-02-28 NOTE — ED Triage Notes (Signed)
Pt endorses lower abd pain x 1 week. Denies n/v/d or urinary sx. VSS.

## 2020-06-11 NOTE — L&D Delivery Note (Signed)
OB/GYN Faculty Practice Delivery Note  Virginia Dyer is a 40 y.o. [id number] s/p SVD at [redacted]w[redacted]d. She was admitted for IOL for cHTN with intrapartum si PreE with SF(BP) as well as A2GDM.   ROM: 3h 63m with clear fluid GBS Status: Negative Maximum Maternal Temperature: 99.2  Labor Progress: Patient presented for induction, received cytotecx1, was started on pitocin and her membranes were artificially ruptured and she progressed to complete.  Delivery Date/Time: 2043 on 04/16/2021 Delivery: Called to room and patient was complete and pushing. Head delivered OA. No nuchal cord present. Shoulder and body delivered in usual fashion. Infant with spontaneous cry, placed on mother's abdomen, dried and stimulated. Cord clamped x 2 after 1-minute delay, and cut by father of baby. Cord blood drawn. Placenta delivered spontaneously with gentle cord traction. Fundus firm with massage and Pitocin. However upon delivery of placenta gush of blood noted and patient's EBL noted to be 400cc. She was given of rectal cytotec and TXA. There was also a manual sweep performed and several golf ball size clots were removed. Labia, perineum, vagina, and cervix inspected and found to have a superficial first degree laceration that was repaired with 3-0 vicryl in running fashion.   Placenta: Intact with extra lobe, 3V cord, L&D Complications: None Lacerations: 1st degree EBL: 750cc Analgesia: Epidrual   Infant: female  APGARs 7,9  weight pending  Warner Mccreedy, MD, MPH OB Fellow, Faculty Practice Center for [facility], [facility] 04/16/2021, 9:28 PM

## 2020-10-07 ENCOUNTER — Inpatient Hospital Stay (HOSPITAL_COMMUNITY): Payer: Self-pay

## 2020-10-07 ENCOUNTER — Inpatient Hospital Stay (HOSPITAL_COMMUNITY)
Admission: AD | Admit: 2020-10-07 | Discharge: 2020-10-07 | Disposition: A | Discharge status: Home / Self Care | Payer: Self-pay | Attending: Obstetrics & Gynecology | Admitting: Obstetrics & Gynecology

## 2020-10-07 ENCOUNTER — Encounter (HOSPITAL_COMMUNITY): Payer: Self-pay | Admitting: *Deleted

## 2020-10-07 DIAGNOSIS — R109 Unspecified abdominal pain: Secondary | ICD-10-CM

## 2020-10-07 DIAGNOSIS — O98819 Other maternal infectious and parasitic diseases complicating pregnancy, unspecified trimester: Secondary | ICD-10-CM

## 2020-10-07 DIAGNOSIS — B373 Candidiasis of vulva and vagina: Secondary | ICD-10-CM

## 2020-10-07 DIAGNOSIS — Z3A10 10 weeks gestation of pregnancy: Secondary | ICD-10-CM

## 2020-10-07 DIAGNOSIS — Z3A11 11 weeks gestation of pregnancy: Secondary | ICD-10-CM | POA: Insufficient documentation

## 2020-10-07 DIAGNOSIS — R102 Pelvic and perineal pain: Secondary | ICD-10-CM | POA: Insufficient documentation

## 2020-10-07 DIAGNOSIS — O26891 Other specified pregnancy related conditions, first trimester: Secondary | ICD-10-CM | POA: Insufficient documentation

## 2020-10-07 LAB — WET PREP, GENITAL
Clue Cells Wet Prep HPF POC: NONE SEEN
Sperm: NONE SEEN
Trich, Wet Prep: NONE SEEN

## 2020-10-07 LAB — URINALYSIS, ROUTINE W REFLEX MICROSCOPIC
Bilirubin Urine: NEGATIVE
Glucose, UA: >=500 mg/dL — AB
Hgb urine dipstick: NEGATIVE
Ketones, ur: NEGATIVE mg/dL
Leukocytes,Ua: NEGATIVE
Nitrite: NEGATIVE
Protein, ur: NEGATIVE mg/dL
Specific Gravity, Urine: 1.022 (ref 1.005–1.030)
pH: 6 (ref 5.0–8.0)

## 2020-10-07 LAB — GLUCOSE, CAPILLARY: Glucose-Capillary: 120 mg/dL — ABNORMAL HIGH (ref 70–99)

## 2020-10-07 LAB — HCG, QUANTITATIVE, PREGNANCY: hCG, Beta Chain, Quant, S: 96123 m[IU]/mL — ABNORMAL HIGH (ref <5)

## 2020-10-07 LAB — CBC
HCT: 37.8 % (ref 36.0–46.0)
Hemoglobin: 13.2 g/dL (ref 12.0–15.0)
MCH: 32.3 pg (ref 26.0–34.0)
MCHC: 34.9 g/dL (ref 30.0–36.0)
MCV: 92.4 fL (ref 80.0–100.0)
Platelets: 206 10*3/uL (ref 150–400)
RBC: 4.09 MIL/uL (ref 3.87–5.11)
RDW: 13.1 % (ref 11.5–15.5)
WBC: 13.6 10*3/uL — ABNORMAL HIGH (ref 4.0–10.5)
nRBC: 0 % (ref 0.0–0.2)

## 2020-10-07 MED ORDER — TERCONAZOLE 0.4 % VA CREA: 1.0000 | TOPICAL_CREAM | Freq: Every day | VAGINAL | 0 refills | Status: DC

## 2020-10-07 NOTE — Discharge Instructions (Signed)
Dolor abdominal durante el embarazo Abdominal Pain During Pregnancy El dolor abdominal es comn durante el embarazo y tiene muchas causas posibles. Algunas causas son ms graves que otras, y a veces la causa se desconoce. El dolor abdominal puede ser un indicio de que est comenzando el parto. Tambin puede ser ocasionado por el crecimiento normal del beb que provoca estiramiento de los msculos y ligamentos durante el embarazo. Siempre informe a su mdico si siente dolor abdominal. Siga estas instrucciones en su casa:  No tenga relaciones sexuales ni se coloque nada dentro de la vagina hasta que el dolor haya desaparecido completamente.  Descanse todo lo que pueda hasta que el dolor se le haya calmado.  Beba suficiente lquido como para mantener la orina de color amarillo plido.  Use los medicamentos de venta libre y los recetados solamente como se lo haya indicado el mdico.  Cumpla con todas las visitas de seguimiento. Esto es importante.   Comunquese con un mdico si:  El dolor contina o empeora despus de descansar.  Siente dolor en la parte inferior del abdomen que: ? Va y viene en intervalos regulares. ? Se extiende a la espalda. ? Es parecido a los clicos menstruales.  Siente dolor o ardor al orinar. Solicite ayuda de inmediato si:  Tiene fiebre, escalofros o falta de aire.  Tiene una hemorragia vaginal abundante.  Tiene prdida de lquidos o elimina tejidos por la vagina.  Vomita o tiene diarrea durante ms de 24horas.  El beb se mueve menos de lo habitual.  Se siente dbil o se desmaya.  Siente dolor intenso en la parte superior del abdomen. Resumen  El dolor abdominal es comn durante el embarazo y tiene muchas causas posibles.  Si siente dolor abdominal durante el embarazo, informe al mdico de inmediato.  Siga las instrucciones del mdico para el cuidado en el hogar y concurra a todas las visitas de seguimiento como se lo hayan indicado. Esta  informacin no tiene como fin reemplazar el consejo del mdico. Asegrese de hacerle al mdico cualquier pregunta que tenga. Document Revised: 03/17/2020 Document Reviewed: 03/17/2020 Elsevier Patient Education  2021 Elsevier Inc.  

## 2020-10-07 NOTE — MAU Note (Signed)
Went to the HD today. Wasn't aware she was preg.  Went because she was having lots of pain in the lower abd for 2 wks. Last wk, she had some bleeding mixed with vaginal d/c, thought it was her period, was only one time, when she wiped. Now she is having a watery d/c.  Is uncomfortable.  That is why she went to the clinic, thought it might be an infection.  has paperwork from HD- copy made for medical records.

## 2020-10-07 NOTE — MAU Provider Note (Addendum)
History     CSN: [id number]  Arrival date & time 10/07/20  1932   Event Date/Time   First Provider Initiated Contact with Patient 10/07/20 2127      Chief Complaint  Patient presents with  . Abdominal Pain  . Vaginal Discharge    Virginia Dyer is a 40 y.o. [id number] at [redacted]w[redacted]d who presents today with lower abdominal pain and vaginal discharge x 2 weeks.   Pelvic Pain The patient's primary symptoms include pelvic pain and vaginal discharge. This is a new problem. Episode onset: 2 weeks ago  The problem occurs intermittently. The problem has been unchanged. The problem affects both sides. She is pregnant. Pertinent negatives include no chills, dysuria, fever, frequency, nausea or vomiting. The vaginal discharge was watery. Vaginal bleeding amount: had some spotting a week ago, but none now. She has not been passing clots. She has not been passing tissue. Nothing aggravates the symptoms. She has tried acetaminophen for the symptoms. The treatment provided mild relief. Her menstrual history has been regular (LMP 07/20/2020).    Past Medical History:  Diagnosis Date  . Hypertension     Past Surgical History:  Procedure Laterality Date  . NO PAST SURGERIES      No family history on file.  Social History   Tobacco Use  . Smoking status: Never Smoker  . Smokeless tobacco: Never Used  Substance Use Topics  . Alcohol use: No  . Drug use: No    OB History    Gravida  5   Para  4   Term  4   Preterm      AB      Living  4     SAB      IAB      Ectopic      Multiple      Live Births  4           Review of Systems  Constitutional: Negative for chills and fever.  Gastrointestinal: Negative for nausea and vomiting.  Genitourinary: Positive for pelvic pain and vaginal discharge. Negative for dysuria, frequency and vaginal bleeding.    Allergies  Patient has no known allergies.  Home Medications    BP 140/85 (BP Location: Right Arm)   Pulse 95    Temp 98.6 F (37 C) (Oral)   Resp 18   Wt 89.8 kg   LMP 07/26/2020 (Approximate)   SpO2 100%   BMI 37.39 kg/m   Physical Exam Vitals and nursing note reviewed.  Constitutional:      General: She is not in acute distress. HENT:     Head: Normocephalic.  Eyes:     Pupils: Pupils are equal, round, and reactive to light.  Cardiovascular:     Rate and Rhythm: Normal rate.  Abdominal:     General: Abdomen is flat.     Palpations: Abdomen is soft.     Tenderness: There is no abdominal tenderness.  Skin:    General: Skin is warm and dry.  Neurological:     Mental Status: She is alert and oriented to person, place, and time.     MAU Course  Procedures (including critical care time)  Labs Reviewed  WET PREP, GENITAL - Abnormal; Notable for the following components:      Result Value   Yeast Wet Prep HPF POC PRESENT (*)    WBC, Wet Prep HPF POC MANY (*)    All other components within normal limits  URINALYSIS, ROUTINE W REFLEX  MICROSCOPIC - Abnormal; Notable for the following components:   APPearance HAZY (*)    Glucose, UA >=500 (*)    Bacteria, UA RARE (*)    All other components within normal limits  CBC - Abnormal; Notable for the following components:   WBC 13.6 (*)    All other components within normal limits  HCG, QUANTITATIVE, PREGNANCY - Abnormal; Notable for the following components:   hCG, Beta Chain, Quant, S 96,123 (*)    All other components within normal limits  GLUCOSE, CAPILLARY - Abnormal; Notable for the following components:   Glucose-Capillary 120 (*)    All other components within normal limits  GC/CHLAMYDIA PROBE AMP ([place]) NOT AT [facility]    US OB Comp Less 14 Wks  Result Date: 10/07/2020 CLINICAL DATA:  Pelvic pain, quantitative HCG 96,123 EXAM: OBSTETRIC <14 WK ULTRASOUND TECHNIQUE: Transabdominal ultrasound was performed for evaluation of the gestation as well as the maternal uterus and adnexal regions. COMPARISON:  None. FINDINGS:  Intrauterine gestational sac: Single Yolk sac:  Visualized. Embryo:  Visualized. Cardiac Activity: Visualized. Heart Rate: 160 bpm CRL:   5 mm   11 w 5 d                  [place] EDC: 04/23/2021 Subchorionic hemorrhage:  None visualized. Maternal uterus/adnexae: Maternal uterus is free of acute worrisome abnormalities. Ovaries are unremarkable with a physiologic corpus luteum in the left ovary. No free fluid in the pelvis. IMPRESSION: Single viable intrauterine gestation at 11 weeks, 5 days by crown-rump length sonographic estimation. No acute sonographic complication. Electronically Signed   By: Kreg Shropshire M.D.   On: 10/07/2020 22:41     1. Pelvic pain affecting pregnancy in first trimester, antepartum    2200 PM care turned over Sabas Sous, CNM  Thressa Sheller DNP, CNM  10/07/20  9:55 PM     MDM   Reassessment (11:01 PM)  -Results as above. -Patient informed of need for yeast treatment. -Rx for Terazol sent to pharmacy on file. -Informed that dates changed from Nov 22 to Nov 13th. -Informed that GC/CT pending and she will be contacted for positive results.  -Encouraged to call or return to MAU if symptoms worsen or with the onset of new symptoms. -Discharged to home in stable condition.  Cherre Robins MSN, CNM Advanced Practice Provider, Center for [employer]

## 2020-10-10 LAB — GC/CHLAMYDIA PROBE AMP (~~LOC~~) NOT AT ARMC
Chlamydia: NEGATIVE
Comment: NEGATIVE
Comment: NORMAL
Neisseria Gonorrhea: NEGATIVE

## 2020-11-03 ENCOUNTER — Other Ambulatory Visit: Payer: Self-pay

## 2020-11-03 ENCOUNTER — Encounter: Payer: Self-pay | Admitting: Certified Nurse Midwife

## 2020-11-03 ENCOUNTER — Other Ambulatory Visit (HOSPITAL_COMMUNITY)
Admission: RE | Admit: 2020-11-03 | Discharge: 2020-11-03 | Disposition: A | Discharge status: Home / Self Care | Payer: Self-pay | Source: Ambulatory Visit | Attending: Certified Nurse Midwife | Admitting: Certified Nurse Midwife

## 2020-11-03 ENCOUNTER — Ambulatory Visit (INDEPENDENT_AMBULATORY_CARE_PROVIDER_SITE_OTHER): Payer: Self-pay | Admitting: Certified Nurse Midwife

## 2020-11-03 VITALS — BP 124/85 | HR 87

## 2020-11-03 DIAGNOSIS — I10 Essential (primary) hypertension: Secondary | ICD-10-CM

## 2020-11-03 DIAGNOSIS — Z3A15 15 weeks gestation of pregnancy: Secondary | ICD-10-CM

## 2020-11-03 DIAGNOSIS — O0990 Supervision of high risk pregnancy, unspecified, unspecified trimester: Secondary | ICD-10-CM | POA: Insufficient documentation

## 2020-11-03 DIAGNOSIS — O09529 Supervision of elderly multigravida, unspecified trimester: Secondary | ICD-10-CM | POA: Insufficient documentation

## 2020-11-03 DIAGNOSIS — Z8632 Personal history of gestational diabetes: Secondary | ICD-10-CM

## 2020-11-03 DIAGNOSIS — O09522 Supervision of elderly multigravida, second trimester: Secondary | ICD-10-CM | POA: Insufficient documentation

## 2020-11-03 DIAGNOSIS — N898 Other specified noninflammatory disorders of vagina: Secondary | ICD-10-CM | POA: Insufficient documentation

## 2020-11-03 DIAGNOSIS — O09299 Supervision of pregnancy with other poor reproductive or obstetric history, unspecified trimester: Secondary | ICD-10-CM | POA: Insufficient documentation

## 2020-11-04 LAB — CERVICOVAGINAL ANCILLARY ONLY
Bacterial Vaginitis (gardnerella): NEGATIVE
Candida Glabrata: NEGATIVE
Candida Vaginitis: POSITIVE — AB
Chlamydia: NEGATIVE
Comment: NEGATIVE
Comment: NEGATIVE
Comment: NEGATIVE
Comment: NEGATIVE
Comment: NEGATIVE
Comment: NORMAL
Neisseria Gonorrhea: NEGATIVE
Trichomonas: NEGATIVE

## 2020-11-04 LAB — PROTEIN / CREATININE RATIO, URINE
Creatinine, Urine: 66.5 mg/dL
Protein, Ur: 4.8 mg/dL
Protein/Creat Ratio: 72 mg/g creat (ref 0–200)

## 2020-11-04 LAB — CYTOLOGY - PAP: Pap: NEGATIVE

## 2020-11-04 NOTE — Progress Notes (Signed)
History:   Virginia Dyer is a 40 y.o. G5P4004 at 71w5dby early ultrasound being seen today for her first obstetrical visit.  Her obstetrical history is significant for advanced maternal age, macrosomia, obesity, pre-eclampsia and GDM. Patient does intend to breast feed. Pregnancy history fully reviewed.  Patient reports no complaints.      HISTORY: OB History  Gravida Para Term Preterm AB Living  '5 4 4 ' 0 0 4  SAB IAB Ectopic Multiple Live Births  0 0 0 0 4    # Outcome Date GA Lbr Len/2nd Weight Sex Delivery Anes PTL Lv  5 Current           4 Term 05/14/09     Vag-Spont   LIV  3 Term 06/23/07     Vag-Spont   LIV  2 Term 09/08/05     Vag-Spont   LIV  1 Term 09/08/01     Vag-Spont   LIV    Last pap smear date unknown, but pt reports it was normal.   Past Medical History:  Diagnosis Date  . Hypertension    Past Surgical History:  Procedure Laterality Date  . NO PAST SURGERIES     No family history on file. Social History   Tobacco Use  . Smoking status: Never Smoker  . Smokeless tobacco: Never Used  Substance Use Topics  . Alcohol use: No  . Drug use: No   No Known Allergies Current Outpatient Medications on File Prior to Visit  Medication Sig Dispense Refill  . Prenatal Vit-Fe Fumarate-FA (PRENATAL PO) Take by mouth.     No current facility-administered medications on file prior to visit.    Review of Systems Pertinent items noted in HPI and remainder of comprehensive ROS otherwise negative. Physical Exam:   Vitals:   11/03/20 1548  BP: 124/85  Pulse: 87   Fetal Heart Rate (bpm): 150 Bedside Ultrasound for FHR check: Viable intrauterine pregnancy with positive cardiac activity note, fetal heart rate 150bpm Patient informed that the ultrasound is considered a limited obstetric ultrasound and is not intended to be a complete ultrasound exam.  Patient also informed that the ultrasound is not being completed with the intent of assessing for fetal or  placental anomalies or any pelvic abnormalities.  Explained that the purpose of today's ultrasound is to assess for fetal heart rate.  Patient acknowledges the purpose of the exam and the limitations of the study.  Constitutional: Well-developed, well-nourished pregnant female in no acute distress.  HEENT: PERRLA Skin: normal color and turgor, no rash Cardiovascular: normal rate & rhythm, no murmur Respiratory: normal effort, lung sounds clear throughout GI: Abd soft, non-tender, pos BS x 4, gravid appropriate for gestational age MS: Extremities nontender, no edema, normal ROM Neurologic: Alert and oriented x 4.  GU: no CVA tenderness Pelvic: NEFG, physiologic discharge, no blood, cervix clean. Pap/swabs collected  Assessment:    Pregnancy: [id number] Active Problem List   Diagnosis Date Noted  . Supervision of high risk pregnancy, antepartum 11/03/2020  . Advanced maternal age in multigravida, second trimester 11/03/2020  . History of gestational diabetes in prior pregnancy, currently pregnant 11/03/2020  . History of pre-eclampsia in prior pregnancy, currently pregnant 11/03/2020  . Hypertension 02/28/2019     Plan:    1. Supervision of high risk pregnancy, antepartum Doing well, starting to feel fetal movement - Genetic Screening - Hemoglobin A1c - Obstetric Panel, Including HIV - Protein / creatinine ratio, urine - Cytology - PAP( [facility]) - Cervicovaginal ancillary only( [place]) - Culture, OB Urine - Comp Met (CMET) - AFP, Serum, Open Spina Bifida  2. Advanced maternal age in multigravida, second trimester - Due to age and history, should mainly have MD visits  3. Vaginal itching - Cervicovaginal ancillary only( [place])  4. History of gestational diabetes in prior pregnancy, currently pregnant - A1C today, will get early GTT  5. History of pre-eclampsia in prior pregnancy, currently pregnant - Baseline labs drawn today  6. Hypertension,  unspecified type - Normotensive today but baseline PEC labs drawn - Protein / creatinine ratio, urine - Comp Met (CMET)  7. [redacted] weeks gestation of pregnancy Routine new OB care including: - Initial labs drawn. - Continue prenatal vitamins. - Problem list reviewed and updated. - Genetic Screening discussed, First trimester screen, Quad screen and NIPS: ordered. - Ultrasound discussed; fetal anatomic survey: ordered. - Anticipatory guidance about prenatal visits given including labs, ultrasounds, and testing. - Discussed usage of Babyscripts and virtual visits as additional source of managing and completing prenatal visits in midst of coronavirus and pandemic.   - Encouraged to complete MyChart Registration for her ability to review results, send requests, and have questions addressed.  - The nature of [place] [facility]/Faculty Practice with multiple MDs and Advanced Practice Providers was explained to patient; also emphasized that residents, students are part of our team. - Routine obstetric precautions reviewed. Encouraged to seek out care at office or emergency room [facility] preferred) for urgent and/or emergent concerns. Return in about 4 weeks (around 12/01/2020) for IN-PERSON, [place].    Gaylan Gerold, MSN, CNM, [place] Certified Nurse Midwife, [facility]

## 2020-11-05 LAB — AFP, SERUM, OPEN SPINA BIFIDA
AFP MoM: 0.8
AFP Value: 21.5 ng/mL
Gest. Age on Collection Date: 15.4 weeks
Maternal Age At EDD: 40.2 yr
OSBR Risk 1 IN: 10000
Test Results:: NEGATIVE
Weight: 198 [lb_av]

## 2020-11-05 LAB — COMPREHENSIVE METABOLIC PANEL
ALT: 12 IU/L (ref 0–32)
AST: 14 IU/L (ref 0–40)
Albumin/Globulin Ratio: 1.4 (ref 1.2–2.2)
Albumin: 3.6 g/dL — ABNORMAL LOW (ref 3.8–4.8)
Alkaline Phosphatase: 50 IU/L (ref 44–121)
BUN/Creatinine Ratio: 17 (ref 9–23)
BUN: 9 mg/dL (ref 6–20)
Bilirubin Total: <0.2 mg/dL (ref 0.0–1.2)
CO2: 21 mmol/L (ref 20–29)
Calcium: 9.2 mg/dL (ref 8.7–10.2)
Chloride: 102 mmol/L (ref 96–106)
Creatinine, Ser: 0.53 mg/dL — ABNORMAL LOW (ref 0.57–1.00)
Globulin, Total: 2.6 g/dL (ref 1.5–4.5)
Glucose: 101 mg/dL — ABNORMAL HIGH (ref 65–99)
Potassium: 4 mmol/L (ref 3.5–5.2)
Sodium: 137 mmol/L (ref 134–144)
Total Protein: 6.2 g/dL (ref 6.0–8.5)
eGFR: 121 mL/min/{1.73_m2} (ref >59)

## 2020-11-05 LAB — OBSTETRIC PANEL, INCLUDING HIV
Antibody Screen: NEGATIVE
Basophils Absolute: 0 10*3/uL (ref 0.0–0.2)
Basos: 0 %
EOS (ABSOLUTE): 0.2 10*3/uL (ref 0.0–0.4)
Eos: 1 %
HIV Screen 4th Generation wRfx: NONREACTIVE
Hematocrit: 38.8 % (ref 34.0–46.6)
Hemoglobin: 12.6 g/dL (ref 11.1–15.9)
Hepatitis B Surface Ag: NEGATIVE
Immature Grans (Abs): 0.1 10*3/uL (ref 0.0–0.1)
Immature Granulocytes: 1 %
Lymphocytes Absolute: 2.5 10*3/uL (ref 0.7–3.1)
Lymphs: 18 %
MCH: 31.3 pg (ref 26.6–33.0)
MCHC: 32.5 g/dL (ref 31.5–35.7)
MCV: 96 fL (ref 79–97)
Monocytes Absolute: 0.8 10*3/uL (ref 0.1–0.9)
Monocytes: 6 %
Neutrophils Absolute: 9.9 10*3/uL — ABNORMAL HIGH (ref 1.4–7.0)
Neutrophils: 74 %
Platelets: 203 10*3/uL (ref 150–450)
RBC: 4.03 x10E6/uL (ref 3.77–5.28)
RDW: 12.8 % (ref 11.7–15.4)
RPR Ser Ql: NONREACTIVE
Rh Factor: POSITIVE
Rubella Antibodies, IGG: 11.2 index (ref >0.99)
WBC: 13.4 10*3/uL — ABNORMAL HIGH (ref 3.4–10.8)

## 2020-11-05 LAB — URINE CULTURE, OB REFLEX

## 2020-11-05 LAB — HEMOGLOBIN A1C
Est. average glucose Bld gHb Est-mCnc: 131 mg/dL
Hgb A1c MFr Bld: 6.2 % — ABNORMAL HIGH (ref 4.8–5.6)

## 2020-11-05 LAB — CULTURE, OB URINE

## 2020-11-08 LAB — CYTOLOGY - PAP
Comment: NEGATIVE
Diagnosis: NEGATIVE
High risk HPV: NEGATIVE

## 2020-11-16 ENCOUNTER — Encounter: Payer: Self-pay | Admitting: *Deleted

## 2020-12-01 ENCOUNTER — Other Ambulatory Visit: Payer: Self-pay

## 2020-12-01 ENCOUNTER — Encounter: Payer: Self-pay | Admitting: Family Medicine

## 2020-12-01 ENCOUNTER — Ambulatory Visit (INDEPENDENT_AMBULATORY_CARE_PROVIDER_SITE_OTHER): Payer: Self-pay | Admitting: Family Medicine

## 2020-12-01 VITALS — BP 127/72 | HR 89 | Wt 198.8 lb

## 2020-12-01 DIAGNOSIS — O0990 Supervision of high risk pregnancy, unspecified, unspecified trimester: Secondary | ICD-10-CM

## 2020-12-01 DIAGNOSIS — O09299 Supervision of pregnancy with other poor reproductive or obstetric history, unspecified trimester: Secondary | ICD-10-CM

## 2020-12-01 DIAGNOSIS — O99210 Obesity complicating pregnancy, unspecified trimester: Secondary | ICD-10-CM | POA: Insufficient documentation

## 2020-12-01 DIAGNOSIS — O10919 Unspecified pre-existing hypertension complicating pregnancy, unspecified trimester: Secondary | ICD-10-CM | POA: Insufficient documentation

## 2020-12-01 DIAGNOSIS — Z8632 Personal history of gestational diabetes: Secondary | ICD-10-CM

## 2020-12-01 MED ORDER — ASPIRIN EC 81 MG PO TBEC: 81.0000 mg | DELAYED_RELEASE_TABLET | Freq: Every day | ORAL | 2 refills | Status: DC

## 2020-12-01 NOTE — Progress Notes (Signed)
   PRENATAL VISIT NOTE  Subjective:  Virginia Dyer is a 40 y.o. G5P4004 at [redacted]w[redacted]d being seen today for ongoing prenatal care.  She is currently monitored for the following issues for this high-risk pregnancy and has Hypertension; Supervision of high risk pregnancy, antepartum; Advanced maternal age in multigravida, second trimester; History of gestational diabetes in prior pregnancy, currently pregnant; History of pre-eclampsia in prior pregnancy, currently pregnant; and Chronic hypertension affecting pregnancy on their problem list.  Patient reports no complaints.  Contractions: Not present. Vag. Bleeding: None.  Movement: Absent. Denies leaking of fluid.   The following portions of the patient's history were reviewed and updated as appropriate: allergies, current medications, past family history, past medical history, past social history, past surgical history and problem list.   Objective:   Vitals:   12/01/20 0846  BP: 127/72  Pulse: 89  Weight: 198 lb 12.8 oz (90.2 kg)    Fetal Status: Fetal Heart Rate (bpm): 143   Movement: Absent     General:  Alert, oriented and cooperative. Patient is in no acute distress.  Skin: Skin is warm and dry. No rash noted.   Cardiovascular: Normal heart rate noted  Respiratory: Normal respiratory effort, no problems with respiration noted  Abdomen: Soft, gravid, appropriate for gestational age.  Pain/Pressure: Present     Pelvic: Cervical exam deferred        Extremities: Normal range of motion.  Edema: None  Mental Status: Normal mood and affect. Normal behavior. Normal judgment and thought content.   Assessment and Plan:  Pregnancy: G5P4004 at [redacted]w[redacted]d  1. Supervision of high risk pregnancy, antepartum Up to date Has not stared ASA and recommended starting today Scheduled for anatomy scan today, had not been ordered previously Patient talked to evelyn today about financial assistance application Going to food market  2. History of  gestational diabetes in prior pregnancy, currently pregnant TWG=7 lb 12.8 oz (3.538 kg)   3. History of pre-eclampsia in prior pregnancy, currently pregnant Reviewed risk of PEC in pregnancy Again ASA recommended and patient was amenable.   4. Chronic hypertension affecting pregnancy Start ASA today  Preterm labor symptoms and general obstetric precautions including but not limited to vaginal bleeding, contractions, leaking of fluid and fetal movement were reviewed in detail with the patient. Please refer to After Visit Summary for other counseling recommendations.   No follow-ups on file.  No future appointments.  Federico Flake, MD

## 2020-12-23 ENCOUNTER — Other Ambulatory Visit: Payer: Self-pay

## 2020-12-23 ENCOUNTER — Other Ambulatory Visit: Payer: Self-pay | Admitting: *Deleted

## 2020-12-23 ENCOUNTER — Ambulatory Visit: Payer: Self-pay | Attending: Maternal & Fetal Medicine | Admitting: Maternal & Fetal Medicine

## 2020-12-23 ENCOUNTER — Ambulatory Visit: Payer: Self-pay | Attending: Family Medicine

## 2020-12-23 ENCOUNTER — Ambulatory Visit: Payer: Self-pay | Admitting: *Deleted

## 2020-12-23 ENCOUNTER — Encounter: Payer: Self-pay | Admitting: *Deleted

## 2020-12-23 ENCOUNTER — Ambulatory Visit: Payer: Self-pay

## 2020-12-23 VITALS — BP 115/63 | HR 82

## 2020-12-23 DIAGNOSIS — O10919 Unspecified pre-existing hypertension complicating pregnancy, unspecified trimester: Secondary | ICD-10-CM

## 2020-12-23 DIAGNOSIS — O0990 Supervision of high risk pregnancy, unspecified, unspecified trimester: Secondary | ICD-10-CM | POA: Insufficient documentation

## 2020-12-23 DIAGNOSIS — O10912 Unspecified pre-existing hypertension complicating pregnancy, second trimester: Secondary | ICD-10-CM

## 2020-12-23 DIAGNOSIS — O09522 Supervision of elderly multigravida, second trimester: Secondary | ICD-10-CM

## 2020-12-23 DIAGNOSIS — O99210 Obesity complicating pregnancy, unspecified trimester: Secondary | ICD-10-CM

## 2020-12-23 DIAGNOSIS — N2889 Other specified disorders of kidney and ureter: Secondary | ICD-10-CM

## 2020-12-23 NOTE — Progress Notes (Signed)
MFM Brief Note  Virginia Dyer is a 40 yo G5P4 who is here at 22w 5d for a detailed ultrasound due to [facility] She is dated by a 11 w 5 d ultrasound.   She reports no signs or symptoms of preterm labor or preeclampsia  Single intrauterine pregnancy was observed with normal anatomy andmeasurements consistent with dates There is good fetal movement and amniotic fluid volume  I discussed with Virginia Dyer today's finding of left unilateral renal pyelectasis with a measurement of 4.3 and 4.6 mm. I discussed the etiology of renal pyelectasis to include normal variant, ureteropelvic or vesicle junction obstruction and urethrovesicle reflux. Prior to 28 weeks the threshold for abnormal is <4mm but after 28 weeks >7 mm. The renal pelvis has an SFU grade 1 appearnace without renal caliectasis.  Thirdly, Virginia Dyer will be 40 yo of age at the time of delivery. We discussed the increased risk of gestational diabetes, preeclampsia, fetal growth restriction and stillbirth. As such we recommend serial growth exams with weekly testing beginning at 36 weeks.  Lastly, she has a diagnosis of chronic hypertension not on medications. Her blood pressure is normal today of  115/63 mmHg. She is taking low dose ASA for preeclampsia prevention.  All questions answered.  I spent 30 minutes with > 50% in face to face consultation.  Novella Olive, MD

## 2020-12-27 ENCOUNTER — Encounter: Payer: Self-pay | Admitting: Family Medicine

## 2020-12-27 DIAGNOSIS — O35EXX0 Maternal care for other (suspected) fetal abnormality and damage, fetal genitourinary anomalies, not applicable or unspecified: Secondary | ICD-10-CM | POA: Insufficient documentation

## 2020-12-27 DIAGNOSIS — O358XX0 Maternal care for other (suspected) fetal abnormality and damage, not applicable or unspecified: Secondary | ICD-10-CM | POA: Insufficient documentation

## 2020-12-27 DIAGNOSIS — O43199 Other malformation of placenta, unspecified trimester: Secondary | ICD-10-CM | POA: Insufficient documentation

## 2020-12-29 ENCOUNTER — Encounter: Payer: Self-pay | Admitting: Family Medicine

## 2021-01-20 ENCOUNTER — Ambulatory Visit: Payer: Self-pay | Attending: Maternal & Fetal Medicine

## 2021-01-20 ENCOUNTER — Ambulatory Visit: Payer: Self-pay

## 2021-01-26 ENCOUNTER — Other Ambulatory Visit: Payer: Self-pay

## 2021-01-26 ENCOUNTER — Ambulatory Visit (INDEPENDENT_AMBULATORY_CARE_PROVIDER_SITE_OTHER): Payer: Self-pay | Admitting: Obstetrics and Gynecology

## 2021-01-26 VITALS — BP 122/75 | HR 89 | Wt 201.7 lb

## 2021-01-26 DIAGNOSIS — O358XX0 Maternal care for other (suspected) fetal abnormality and damage, not applicable or unspecified: Secondary | ICD-10-CM

## 2021-01-26 DIAGNOSIS — O09522 Supervision of elderly multigravida, second trimester: Secondary | ICD-10-CM

## 2021-01-26 DIAGNOSIS — Z8632 Personal history of gestational diabetes: Secondary | ICD-10-CM

## 2021-01-26 DIAGNOSIS — Z23 Encounter for immunization: Secondary | ICD-10-CM

## 2021-01-26 DIAGNOSIS — O0990 Supervision of high risk pregnancy, unspecified, unspecified trimester: Secondary | ICD-10-CM

## 2021-01-26 DIAGNOSIS — Z3A27 27 weeks gestation of pregnancy: Secondary | ICD-10-CM | POA: Insufficient documentation

## 2021-01-26 DIAGNOSIS — O99210 Obesity complicating pregnancy, unspecified trimester: Secondary | ICD-10-CM

## 2021-01-26 DIAGNOSIS — O35EXX0 Maternal care for other (suspected) fetal abnormality and damage, fetal genitourinary anomalies, not applicable or unspecified: Secondary | ICD-10-CM

## 2021-01-26 DIAGNOSIS — O43199 Other malformation of placenta, unspecified trimester: Secondary | ICD-10-CM

## 2021-01-26 DIAGNOSIS — O09299 Supervision of pregnancy with other poor reproductive or obstetric history, unspecified trimester: Secondary | ICD-10-CM

## 2021-01-26 DIAGNOSIS — O10919 Unspecified pre-existing hypertension complicating pregnancy, unspecified trimester: Secondary | ICD-10-CM

## 2021-01-26 NOTE — Progress Notes (Signed)
   PRENATAL VISIT NOTE  Subjective:  Virginia Dyer is a 40 y.o. G5P4004 at [redacted]w[redacted]d being seen today for ongoing prenatal care.  She is currently monitored for the following issues for this high-risk pregnancy and has Hypertension; Supervision of high risk pregnancy, antepartum; Advanced maternal age in multigravida; History of gestational diabetes in prior pregnancy, currently pregnant; History of pre-eclampsia in prior pregnancy, currently pregnant; Chronic hypertension affecting pregnancy; Obesity during pregnancy, antepartum; Fetal renal anomaly, single gestation; Placenta succenturiata, antepartum; and [redacted] weeks gestation of pregnancy on their problem list.  Patient doing well with no acute concerns today. She reports occasional contractions.  Contractions: Irritability. Vag. Bleeding: None.  Movement: Present. Denies leaking of fluid.   Pt stated that she had a "hard belly" a few times last week with pelvic pressure.  No symptoms today.  The following portions of the patient's history were reviewed and updated as appropriate: allergies, current medications, past family history, past medical history, past social history, past surgical history and problem list. Problem list updated.  Objective:   Vitals:   01/26/21 0839  BP: 122/75  Pulse: 89  Weight: 201 lb 11.2 oz (91.5 kg)    Fetal Status: Fetal Heart Rate (bpm): 146 Fundal Height: 27 cm Movement: Present     General:  Alert, oriented and cooperative. Patient is in no acute distress.  Skin: Skin is warm and dry. No rash noted.   Cardiovascular: Normal heart rate noted  Respiratory: Normal respiratory effort, no problems with respiration noted  Abdomen: Soft, gravid, appropriate for gestational age.  Pain/Pressure: Present     Pelvic: Cervical exam deferred        Extremities: Normal range of motion.  Edema: Trace  Mental Status:  Normal mood and affect. Normal behavior. Normal judgment and thought content.   Assessment and  Plan:  Pregnancy: G5P4004 at [redacted]w[redacted]d  1. Supervision of high risk pregnancy, antepartum Continue routine care, pt advised to increase hydration and to seek evaluation at [facility] if preterm ctx become more prevalent - RPR - CBC - HIV Antibody (routine testing w rflx) - Tdap vaccine greater than or equal to 7yo IM - Glucose Tolerance, 2 Hours w/1 Hour  2. [redacted] weeks gestation of pregnancy   3. Multigravida of advanced maternal age in second trimester Pt continue baby ASA, weekly testing at 36 weeks  4. History of gestational diabetes in prior pregnancy, currently pregnant 2 hour GTT today  5. History of pre-eclampsia in prior pregnancy, currently pregnant No s/sx or pre-E  6. Obesity during pregnancy, antepartum   7. Placenta succenturiata, antepartum   8. Fetal renal anomaly, single gestation Growth scan/follow up scan in 1 week  9. Chronic hypertension affecting pregnancy BP well controlled without medications  Preterm labor symptoms and general obstetric precautions including but not limited to vaginal bleeding, contractions, leaking of fluid and fetal movement were reviewed in detail with the patient.  Please refer to After Visit Summary for other counseling recommendations.   Return in about 2 weeks (around 02/09/2021) for [facility], in person.   Mariel Aloe, MD Faculty Attending Center for [facility]

## 2021-01-27 LAB — GLUCOSE TOLERANCE, 2 HOURS W/ 1HR
Glucose, 1 hour: 247 mg/dL — ABNORMAL HIGH (ref 65–179)
Glucose, 2 hour: 177 mg/dL — ABNORMAL HIGH (ref 65–152)
Glucose, Fasting: 120 mg/dL — ABNORMAL HIGH (ref 65–91)

## 2021-01-27 LAB — CBC
Hematocrit: 36.2 % (ref 34.0–46.6)
Hemoglobin: 12 g/dL (ref 11.1–15.9)
MCH: 31.4 pg (ref 26.6–33.0)
MCHC: 33.1 g/dL (ref 31.5–35.7)
MCV: 95 fL (ref 79–97)
Platelets: 186 10*3/uL (ref 150–450)
RBC: 3.82 x10E6/uL (ref 3.77–5.28)
RDW: 12.8 % (ref 11.7–15.4)
WBC: 9.1 10*3/uL (ref 3.4–10.8)

## 2021-01-27 LAB — HIV ANTIBODY (ROUTINE TESTING W REFLEX): HIV Screen 4th Generation wRfx: NONREACTIVE

## 2021-01-27 LAB — RPR: RPR Ser Ql: NONREACTIVE

## 2021-01-30 ENCOUNTER — Telehealth: Payer: Self-pay

## 2021-01-30 NOTE — Telephone Encounter (Signed)
Called Pt using [employer] [place] id# [phone] to call Pt & advised that her Glucose test came back abnormal. No answer, left VM for call back.

## 2021-02-07 ENCOUNTER — Other Ambulatory Visit: Payer: Self-pay | Admitting: *Deleted

## 2021-02-07 DIAGNOSIS — O99810 Abnormal glucose complicating pregnancy: Secondary | ICD-10-CM

## 2021-02-09 ENCOUNTER — Other Ambulatory Visit: Payer: Self-pay

## 2021-02-09 ENCOUNTER — Encounter: Payer: Self-pay | Attending: Obstetrics and Gynecology | Admitting: Registered"

## 2021-02-09 ENCOUNTER — Ambulatory Visit: Payer: Self-pay | Admitting: Registered"

## 2021-02-09 DIAGNOSIS — O24419 Gestational diabetes mellitus in pregnancy, unspecified control: Secondary | ICD-10-CM

## 2021-02-10 DIAGNOSIS — O24419 Gestational diabetes mellitus in pregnancy, unspecified control: Secondary | ICD-10-CM | POA: Insufficient documentation

## 2021-02-10 NOTE — Progress Notes (Signed)
Spanish interpreter Harriett Sine [phone] from [employer] video  Patient was seen for Gestational Diabetes self-management on 02/09/21  Start time 1120 and End time 1220   Estimated due date: 04/23/21; [redacted]w[redacted]d  Clinical: Medications: aspirin, prenatal Medical History: GDM 12 yrs ago Labs: OGTT elevated FBS, 1 & 2 hr, A1c 6.2% 11/03/20  Dietary and Lifestyle History:  Physical Activity: ADLs Stress: "not much" Sleep: Patient states she sleeps "a lot" including 2 hours during the day and all night (did not quantify hours). Pt states she has been very tired x1 month. RD encouraged patient to mention this at MD appt.   24 hr Recall: First Meal: oatmeal, apple Snack: none Second meal: Steak sandwich, fries, reg Coke Snack: Third meal:Fish, tomato, onion, picadillo (beef) Snack: Beverages: water, coke  NUTRITION INTERVENTION  Nutrition education (E-1) on the following topics:   Initial Follow-up  [x]  []  Definition of Gestational Diabetes [x]  []  Why dietary management is important in controlling blood glucose [x]  []  Effects each nutrient has on blood glucose levels []  []  Simple carbohydrates vs complex carbohydrates []  []  Fluid intake [x]  []  Creating a balanced meal plan []  []  Carbohydrate counting  [x]  []  When to check blood glucose levels [x]  []  Proper blood glucose monitoring techniques [x]  []  Effect of stress and stress reduction techniques  [x]  []  Exercise effect on blood glucose levels, appropriate exercise during pregnancy [x]  []  Importance of limiting caffeine and abstaining from alcohol and smoking [x]  []  Medications used for blood sugar control during pregnancy [x]  []  Hypoglycemia and rule of 15 [x]  []  Postpartum self care  Blood glucose monitor given: Prodigy  Lot # CBG: 125 mg/dL  Patient instructed to monitor glucose levels: FBS: 60 - ? 95 mg/dL (some clinics use 90 for cutoff) 1 hour: ? 140 mg/dL 2 hour: ? mg/dL  Patient received handouts: Nutrition Diabetes  and Pregnancy Carbohydrate Counting List  Patient will be seen for follow-up as needed.

## 2021-02-14 ENCOUNTER — Other Ambulatory Visit: Payer: Self-pay

## 2021-02-14 ENCOUNTER — Ambulatory Visit (INDEPENDENT_AMBULATORY_CARE_PROVIDER_SITE_OTHER): Payer: Self-pay | Admitting: Family Medicine

## 2021-02-14 VITALS — BP 125/79 | HR 91 | Wt 205.1 lb

## 2021-02-14 DIAGNOSIS — O10919 Unspecified pre-existing hypertension complicating pregnancy, unspecified trimester: Secondary | ICD-10-CM

## 2021-02-14 DIAGNOSIS — O24419 Gestational diabetes mellitus in pregnancy, unspecified control: Secondary | ICD-10-CM

## 2021-02-14 DIAGNOSIS — Z3A30 30 weeks gestation of pregnancy: Secondary | ICD-10-CM

## 2021-02-14 DIAGNOSIS — O0990 Supervision of high risk pregnancy, unspecified, unspecified trimester: Secondary | ICD-10-CM

## 2021-02-14 NOTE — Progress Notes (Signed)
Pt did not bring Glucose Log. 

## 2021-02-14 NOTE — Progress Notes (Signed)
   Subjective:  Virginia Dyer is a 40 y.o. G5P4004 at [redacted]w[redacted]d being seen today for ongoing prenatal care.  She is currently monitored for the following issues for this high-risk pregnancy and has Hypertension; Supervision of high risk pregnancy, antepartum; Advanced maternal age in multigravida; History of gestational diabetes in prior pregnancy, currently pregnant; History of pre-eclampsia in prior pregnancy, currently pregnant; Chronic hypertension affecting pregnancy; Obesity during pregnancy, antepartum; Fetal renal anomaly, single gestation; Placenta succenturiata, antepartum; [redacted] weeks gestation of pregnancy; and Gestational diabetes mellitus (GDM), antepartum on their problem list.  Patient reports no complaints.  Contractions: Irritability. Vag. Bleeding: None.  Movement: Present. Denies leaking of fluid.   The following portions of the patient's history were reviewed and updated as appropriate: allergies, current medications, past family history, past medical history, past social history, past surgical history and problem list. Problem list updated.  Objective:   Vitals:   02/14/21 1503  BP: 125/79  Pulse: 91  Weight: 205 lb 1.6 oz (93 kg)    Fetal Status: Fetal Heart Rate (bpm): 146 Fundal Height: 31 cm Movement: Present     General:  Alert, oriented and cooperative. Patient is in no acute distress.  Skin: Skin is warm and dry. No rash noted.   Cardiovascular: Normal heart rate noted  Respiratory: Normal respiratory effort, no problems with respiration noted  Abdomen: Soft, gravid, appropriate for gestational age. Pain/Pressure: Present     Pelvic: Vag. Bleeding: None     Cervical exam deferred        Extremities: Normal range of motion.  Edema: Trace  Mental Status: Normal mood and affect. Normal behavior. Normal judgment and thought content.    Assessment and Plan:  Pregnancy: G5P4004 at [redacted]w[redacted]d  1. Gestational diabetes mellitus (GDM), antepartum, gestational diabetes  method of control unspecified Patient with diagnosis of GDM, just picked up her BG supplies on Friday and started checking BG today Did not bring log in today.  Fasting BG today 110, 2 hrs post prandial 120 (after eating chicken breast, tomatoes, and tortillas) Hx of GDM also in last 3 pregnancies Since just started checking BG today not enough data to know if she needs medications.  - Counseled to continue checking BG and come in to RN visit in 1 week with her BG log  2. Supervision of high risk pregnancy, antepartum 3. [redacted] weeks gestation of pregnancy New dx of GDM, otherwise doing well. Discussed contraception and would like [facility]UD placed at [facility]. Would be interested in interim depo is possible without having insurance. Also her husband is applying for funds to get vasectomy. Does plan to breast and bottle feed. Taking her ASA.  - f/up in 2 weeks  4. Chronic hypertension affecting pregnancy Not on medications. Current BP 125/79. Taking ASA. Discussed symptoms to seek care for.    Preterm labor symptoms and general obstetric precautions including but not limited to vaginal bleeding, contractions, leaking of fluid and fetal movement were reviewed in detail with the patient. Please refer to After Visit Summary for other counseling recommendations.  Return in about 2 weeks (around 02/28/2021) for HROB, also RN visit for BG check in 1 week.  Warner Mccreedy, MD, MPH OB Fellow, Faculty Practice

## 2021-02-22 ENCOUNTER — Other Ambulatory Visit: Payer: Self-pay

## 2021-02-22 ENCOUNTER — Ambulatory Visit (INDEPENDENT_AMBULATORY_CARE_PROVIDER_SITE_OTHER): Payer: Self-pay

## 2021-02-22 VITALS — BP 110/70 | HR 85 | Wt 202.3 lb

## 2021-02-22 DIAGNOSIS — O24419 Gestational diabetes mellitus in pregnancy, unspecified control: Secondary | ICD-10-CM

## 2021-02-22 MED ORDER — METFORMIN HCL 500 MG PO TABS: 500.0000 mg | ORAL_TABLET | Freq: Two times a day (BID) | ORAL | 0 refills | Status: DC

## 2021-02-22 NOTE — Progress Notes (Signed)
Pt here today for follow up on BG log. Pt is currently 31.[redacted]weeks pregnant with GDM. Here today to show [place] log and possible need of medication. Eda R. Interpreted for pt.   BG log reviewed by Dr Para March. Pt advised to see Diabetes education and also start Metformin 500mg  BID and to keep BG log daily until Dr reviews again on 9/21 at [facility] appt. Pt verbalized understanding and agreeable to plan of care.   Rx Metformin 500mg  BID # 120, 0RF sent to pharmacy on file.  Referral placed for Diabetes Ed and appt made on 9/29 at 1015. Pt aware of all appts.   , RN

## 2021-02-27 ENCOUNTER — Ambulatory Visit: Payer: Self-pay | Attending: Maternal & Fetal Medicine

## 2021-02-27 ENCOUNTER — Other Ambulatory Visit: Payer: Self-pay | Admitting: Maternal & Fetal Medicine

## 2021-02-27 ENCOUNTER — Ambulatory Visit: Payer: Self-pay | Admitting: *Deleted

## 2021-02-27 ENCOUNTER — Other Ambulatory Visit: Payer: Self-pay

## 2021-02-27 ENCOUNTER — Encounter: Payer: Self-pay | Admitting: *Deleted

## 2021-02-27 VITALS — BP 112/65 | HR 83

## 2021-02-27 DIAGNOSIS — O10912 Unspecified pre-existing hypertension complicating pregnancy, second trimester: Secondary | ICD-10-CM | POA: Insufficient documentation

## 2021-02-27 DIAGNOSIS — O43199 Other malformation of placenta, unspecified trimester: Secondary | ICD-10-CM | POA: Insufficient documentation

## 2021-02-27 DIAGNOSIS — O09523 Supervision of elderly multigravida, third trimester: Secondary | ICD-10-CM

## 2021-02-27 DIAGNOSIS — Z3A32 32 weeks gestation of pregnancy: Secondary | ICD-10-CM

## 2021-02-27 DIAGNOSIS — O10013 Pre-existing essential hypertension complicating pregnancy, third trimester: Secondary | ICD-10-CM

## 2021-02-27 DIAGNOSIS — O0990 Supervision of high risk pregnancy, unspecified, unspecified trimester: Secondary | ICD-10-CM

## 2021-02-27 DIAGNOSIS — E669 Obesity, unspecified: Secondary | ICD-10-CM

## 2021-02-27 DIAGNOSIS — O35EXX0 Maternal care for other (suspected) fetal abnormality and damage, fetal genitourinary anomalies, not applicable or unspecified: Secondary | ICD-10-CM

## 2021-02-27 DIAGNOSIS — O09293 Supervision of pregnancy with other poor reproductive or obstetric history, third trimester: Secondary | ICD-10-CM

## 2021-02-27 DIAGNOSIS — O99210 Obesity complicating pregnancy, unspecified trimester: Secondary | ICD-10-CM | POA: Insufficient documentation

## 2021-02-27 DIAGNOSIS — O10919 Unspecified pre-existing hypertension complicating pregnancy, unspecified trimester: Secondary | ICD-10-CM | POA: Insufficient documentation

## 2021-02-27 DIAGNOSIS — O99213 Obesity complicating pregnancy, third trimester: Secondary | ICD-10-CM

## 2021-02-27 DIAGNOSIS — O358XX0 Maternal care for other (suspected) fetal abnormality and damage, not applicable or unspecified: Secondary | ICD-10-CM | POA: Insufficient documentation

## 2021-02-28 ENCOUNTER — Other Ambulatory Visit: Payer: Self-pay | Admitting: *Deleted

## 2021-02-28 DIAGNOSIS — O24419 Gestational diabetes mellitus in pregnancy, unspecified control: Secondary | ICD-10-CM

## 2021-02-28 DIAGNOSIS — O09523 Supervision of elderly multigravida, third trimester: Secondary | ICD-10-CM

## 2021-02-28 DIAGNOSIS — O10913 Unspecified pre-existing hypertension complicating pregnancy, third trimester: Secondary | ICD-10-CM

## 2021-03-01 ENCOUNTER — Other Ambulatory Visit: Payer: Self-pay

## 2021-03-01 ENCOUNTER — Encounter: Payer: Self-pay | Admitting: Obstetrics & Gynecology

## 2021-03-01 ENCOUNTER — Ambulatory Visit (INDEPENDENT_AMBULATORY_CARE_PROVIDER_SITE_OTHER): Payer: Self-pay | Admitting: Obstetrics & Gynecology

## 2021-03-01 VITALS — BP 129/83 | HR 102 | Wt 202.1 lb

## 2021-03-01 DIAGNOSIS — Z23 Encounter for immunization: Secondary | ICD-10-CM

## 2021-03-01 DIAGNOSIS — O10919 Unspecified pre-existing hypertension complicating pregnancy, unspecified trimester: Secondary | ICD-10-CM

## 2021-03-01 DIAGNOSIS — O09299 Supervision of pregnancy with other poor reproductive or obstetric history, unspecified trimester: Secondary | ICD-10-CM

## 2021-03-01 DIAGNOSIS — O24415 Gestational diabetes mellitus in pregnancy, controlled by oral hypoglycemic drugs: Secondary | ICD-10-CM

## 2021-03-01 DIAGNOSIS — O0990 Supervision of high risk pregnancy, unspecified, unspecified trimester: Secondary | ICD-10-CM

## 2021-03-01 MED ORDER — METFORMIN HCL 500 MG PO TABS: ORAL_TABLET | ORAL | 0 refills | Status: DC

## 2021-03-01 NOTE — Progress Notes (Signed)
   PRENATAL VISIT NOTE  Subjective:  Virginia Dyer is a 40 y.o. G5P4004 at [redacted]w[redacted]d being seen today for ongoing prenatal care.  She is currently monitored for the following issues for this high-risk pregnancy and has Hypertension; Supervision of high risk pregnancy, antepartum; Advanced maternal age in multigravida; History of gestational diabetes in prior pregnancy, currently pregnant; History of pre-eclampsia in prior pregnancy, currently pregnant; Chronic hypertension affecting pregnancy; Obesity during pregnancy, antepartum; Fetal renal anomaly, single gestation; Placenta succenturiata, antepartum; [redacted] weeks gestation of pregnancy; and Gestational diabetes mellitus (GDM), antepartum on their problem list.  Patient reports no complaints.  Contractions: Irritability. Vag. Bleeding: None.  Movement: Present. Denies leaking of fluid.   The following portions of the patient's history were reviewed and updated as appropriate: allergies, current medications, past family history, past medical history, past social history, past surgical history and problem list.   Objective:   Vitals:   03/01/21 1043  BP: 129/83  Pulse: (!) 102  Weight: 202 lb 1.6 oz (91.7 kg)    Fetal Status: Fetal Heart Rate (bpm): 150   Movement: Present     General:  Alert, oriented and cooperative. Patient is in no acute distress.  Skin: Skin is warm and dry. No rash noted.   Cardiovascular: Normal heart rate noted  Respiratory: Normal respiratory effort, no problems with respiration noted  Abdomen: Soft, gravid, appropriate for gestational age.  Pain/Pressure: Present     Pelvic: Cervical exam deferred        Extremities: Normal range of motion.  Edema: None  Mental Status: Normal mood and affect. Normal behavior. Normal judgment and thought content.   Assessment and Plan:  Pregnancy: G5P4004 at [redacted]w[redacted]d 1. Supervision of high risk pregnancy, antepartum  - Flu Vaccine QUAD 50mo+IM (Fluarix, Fluzone & Alfiuria  Quad PF)  2. Gestational diabetes mellitus (GDM) controlled on oral hypoglycemic drug, antepartum FBS 102-109, PP up to 145, will increase metformin - metFORMIN (GLUCOPHAGE) 500 MG tablet; One tablet by mouth in the morning and 2 at night  Dispense: 120 tablet; Refill: 0  3. Chronic hypertension affecting pregnancy No medication required  4. History of pre-eclampsia in prior pregnancy, currently pregnant    labor symptoms and general obstetric precautions including but not limited to vaginal bleeding, contractions, leaking of fluid and fetal movement were reviewed in detail with the patient. Please refer to After Visit Summary for other counseling recommendations.   Return in about 2 weeks (around 03/15/2021).  Future Appointments  Date Time Provider Department Center  03/09/2021 10:15 AM [facility]  03/09/2021 11:15 AM [facility] NST [facility]  03/16/2021  9:15 AM [facility]ST [facility]  03/16/2021 10:15 AM Constant, Peggy, MD [facility]  03/28/2021 10:45 AM [facility] NURSE [facility]  03/28/2021 11:00 AM [facility] US1 [facility]    Scheryl Darter, MD

## 2021-03-08 ENCOUNTER — Other Ambulatory Visit: Payer: Self-pay

## 2021-03-09 ENCOUNTER — Ambulatory Visit (INDEPENDENT_AMBULATORY_CARE_PROVIDER_SITE_OTHER): Payer: Self-pay

## 2021-03-09 ENCOUNTER — Encounter: Payer: Self-pay | Attending: Obstetrics and Gynecology | Admitting: Registered"

## 2021-03-09 ENCOUNTER — Ambulatory Visit: Payer: Self-pay | Admitting: *Deleted

## 2021-03-09 ENCOUNTER — Other Ambulatory Visit: Payer: Self-pay

## 2021-03-09 ENCOUNTER — Ambulatory Visit: Payer: Self-pay | Admitting: Registered"

## 2021-03-09 VITALS — BP 132/83 | HR 78

## 2021-03-09 DIAGNOSIS — O24415 Gestational diabetes mellitus in pregnancy, controlled by oral hypoglycemic drugs: Secondary | ICD-10-CM

## 2021-03-09 DIAGNOSIS — Z3A33 33 weeks gestation of pregnancy: Secondary | ICD-10-CM

## 2021-03-09 DIAGNOSIS — O24419 Gestational diabetes mellitus in pregnancy, unspecified control: Secondary | ICD-10-CM

## 2021-03-09 DIAGNOSIS — O99810 Abnormal glucose complicating pregnancy: Secondary | ICD-10-CM | POA: Insufficient documentation

## 2021-03-09 DIAGNOSIS — Z3A00 Weeks of gestation of pregnancy not specified: Secondary | ICD-10-CM | POA: Insufficient documentation

## 2021-03-09 NOTE — Progress Notes (Signed)
Spanish interpreter Dois Davenport [phone] from AMN video  Patient was seen for Gestational Diabetes self-management on 03/09/21  Start time 1015 and End time 1045  Estimated due date: 04/23/21; [redacted]w[redacted]d  Clinical: Medications: aspirin, prenatal Medical History: GDM 12 yrs ago Labs: OGTT elevated FBS, 1 & 2 hr, A1c 6.2% 11/03/20  Dietary and Lifestyle History:  Patient has started daily walking after recommendation during initial visit. Pt reports she can walk 30 min without discomfort, and pregnancy belt helps. Patient also stopped drinking soda and instead reports drinking no-sugar added cranberry juice with meals. Patient verified the specific product from internet search; the product is sweetened with grape, apple and pear juice 28 grams natural sugar. Patient states she will switch to Cranberry Diet 5.  Patient reports eating quantities of rice and tortillas that would equal 4-5 carbohydrate servings per meal. Patient states she would be hungry if she limited her rice to 1 cup and only 1 tortilla. Patient's elevated PPBG are usually not much more than 140 mg/dL with current portions. Patient was encouraged to stop drinking juice with meals and if that is not enough to bring down blood sugar to also cut back on rice and tortillas.  Physical Activity: 7x/week walking 30 min in the afternoon  Stress: not assessed Sleep: not assessed this visit   24 hr Recall: First Meal: 2 eggs, 1 slice of bread Snack: none Second meal: eggs, nopale, 1 1/2 c rice, 2 tortillas, cranberry juice Snack: Third meal: 2 tortillas, cheese, chicken, mole made with raisins, almonds, chili, cranberry juice Snack: Beverages: water, cranberry juice  NUTRITION INTERVENTION  Nutrition education (E-1) on the following topics:   Initial Follow-up  [x]  []  Definition of Gestational Diabetes [x]  []  Why dietary management is important in controlling blood glucose [x]  []  Effects each nutrient has on blood glucose  levels []  []  Simple carbohydrates vs complex carbohydrates []  []  Fluid intake [x]  []  Creating a balanced meal plan []  []  Carbohydrate counting  [x]  []  When to check blood glucose levels [x]  []  Proper blood glucose monitoring techniques [x]  []  Effect of stress and stress reduction techniques  [x]  []  Exercise effect on blood glucose levels, appropriate exercise during pregnancy [x]  []  Importance of limiting caffeine and abstaining from alcohol and smoking [x]  []  Medications used for blood sugar control during pregnancy [x]  []  Hypoglycemia and rule of 15 [x]  []  Postpartum self care  Patient instructed to monitor glucose levels: FBS: 60 - ? 95 mg/dL (some clinics use 90 for cutoff) 1 hour: ? 140 mg/dL 2 hour: ? mg/dL  Patient received handouts: none  Patient will be seen in 2 weeks or as needed for follow-up.

## 2021-03-09 NOTE — Progress Notes (Signed)

## 2021-03-16 ENCOUNTER — Ambulatory Visit: Payer: Self-pay | Admitting: *Deleted

## 2021-03-16 ENCOUNTER — Ambulatory Visit (INDEPENDENT_AMBULATORY_CARE_PROVIDER_SITE_OTHER): Payer: Self-pay

## 2021-03-16 ENCOUNTER — Ambulatory Visit (INDEPENDENT_AMBULATORY_CARE_PROVIDER_SITE_OTHER): Payer: Self-pay | Admitting: Obstetrics and Gynecology

## 2021-03-16 ENCOUNTER — Encounter: Payer: Self-pay | Admitting: Obstetrics and Gynecology

## 2021-03-16 ENCOUNTER — Other Ambulatory Visit: Payer: Self-pay

## 2021-03-16 VITALS — BP 133/81 | HR 83 | Wt 201.7 lb

## 2021-03-16 DIAGNOSIS — O10919 Unspecified pre-existing hypertension complicating pregnancy, unspecified trimester: Secondary | ICD-10-CM

## 2021-03-16 DIAGNOSIS — O09523 Supervision of elderly multigravida, third trimester: Secondary | ICD-10-CM

## 2021-03-16 DIAGNOSIS — O99210 Obesity complicating pregnancy, unspecified trimester: Secondary | ICD-10-CM

## 2021-03-16 DIAGNOSIS — O0990 Supervision of high risk pregnancy, unspecified, unspecified trimester: Secondary | ICD-10-CM

## 2021-03-16 DIAGNOSIS — Z3A34 34 weeks gestation of pregnancy: Secondary | ICD-10-CM

## 2021-03-16 DIAGNOSIS — O24415 Gestational diabetes mellitus in pregnancy, controlled by oral hypoglycemic drugs: Secondary | ICD-10-CM

## 2021-03-16 MED ORDER — METFORMIN HCL 1000 MG PO TABS: 1000.0000 mg | ORAL_TABLET | Freq: Two times a day (BID) | ORAL | 3 refills | Status: AC

## 2021-03-16 NOTE — Progress Notes (Signed)
Next [place] for growth scheduled on 10/18

## 2021-03-16 NOTE — Progress Notes (Signed)
   PRENATAL VISIT NOTE  Subjective:  Virginia Dyer is a 40 y.o. [id number] at [redacted]w[redacted]d being seen today for ongoing prenatal care.  She is currently monitored for the following issues for this high-risk pregnancy and has Hypertension; Supervision of high risk pregnancy, antepartum; Advanced maternal age in multigravida; History of gestational diabetes in prior pregnancy, currently pregnant; History of pre-eclampsia in prior pregnancy, currently pregnant; Chronic hypertension affecting pregnancy; Obesity during pregnancy, antepartum; Fetal renal anomaly, single gestation; Placenta succenturiata, antepartum; [redacted] weeks gestation of pregnancy; and Gestational diabetes mellitus (GDM), antepartum on their problem list.  Patient reports no complaints.  Contractions: Irregular. Vag. Bleeding: None.  Movement: Present. Denies leaking of fluid.   The following portions of the patient's history were reviewed and updated as appropriate: allergies, current medications, past family history, past medical history, past social history, past surgical history and problem list.   Objective:   Vitals:   03/16/21 0949  BP: 133/81  Pulse: 83  Weight: 201 lb 11.2 oz (91.5 kg)    Fetal Status: Fetal Heart Rate (bpm): RNST   Movement: Present     General:  Alert, oriented and cooperative. Patient is in no acute distress.  Skin: Skin is warm and dry. No rash noted.   Cardiovascular: Normal heart rate noted  Respiratory: Normal respiratory effort, no problems with respiration noted  Abdomen: Soft, gravid, appropriate for gestational age.  Pain/Pressure: Present     Pelvic: Cervical exam deferred        Extremities: Normal range of motion.     Mental Status: Normal mood and affect. Normal behavior. Normal judgment and thought content.   Assessment and Plan:  Pregnancy: G5P4004 at [redacted]w[redacted]d 1. Supervision of high risk pregnancy, antepartum Patient is doing well without complaints   2. Gestational diabetes  mellitus (GDM) controlled on oral hypoglycemic drug, antepartum CBGs reviewed and fasting values improved however pp values are elevated consistently in the 140's Will increase metformin to 1000 mg BID Continue weekly antenatal testing  Follow up growth ultrasound scheduled  3. Chronic hypertension affecting pregnancy Stable without medication Continue ASA  4. Multigravida of advanced maternal age in third trimester   5. Obesity during pregnancy, antepartum   Preterm labor symptoms and general obstetric precautions including but not limited to vaginal bleeding, contractions, leaking of fluid and fetal movement were reviewed in detail with the patient. Please refer to After Visit Summary for other counseling recommendations.   Return in about 1 week (around 03/23/2021) for NST/BPP;  10/26 HOB, NST/BPP - all appts scheduled.  Future Appointments  Date Time Provider Department Center  03/16/2021 10:35 AM [facility] US1 [facility]  03/23/2021 10:15 AM [facility] NST [facility]  03/23/2021 11:15 AM [facility]EDUCATION [facility]  03/28/2021 10:45 AM [facility] NURSE [facility]  03/28/2021 11:00 AM [facility] US1 [facility]  04/05/2021  9:15 AM Adam Phenix, MD [facility]  04/05/2021 10:15 AM [facility] NST [facility]    Catalina Antigua, MD

## 2021-03-23 ENCOUNTER — Ambulatory Visit (INDEPENDENT_AMBULATORY_CARE_PROVIDER_SITE_OTHER): Payer: Self-pay

## 2021-03-23 ENCOUNTER — Encounter: Payer: Self-pay | Attending: Obstetrics and Gynecology | Admitting: Registered"

## 2021-03-23 ENCOUNTER — Other Ambulatory Visit: Payer: Self-pay

## 2021-03-23 ENCOUNTER — Ambulatory Visit: Payer: Self-pay | Admitting: *Deleted

## 2021-03-23 ENCOUNTER — Ambulatory Visit: Payer: Self-pay | Admitting: Registered"

## 2021-03-23 VITALS — BP 133/80 | HR 81 | Wt 202.9 lb

## 2021-03-23 DIAGNOSIS — O09523 Supervision of elderly multigravida, third trimester: Secondary | ICD-10-CM

## 2021-03-23 DIAGNOSIS — O99810 Abnormal glucose complicating pregnancy: Secondary | ICD-10-CM | POA: Insufficient documentation

## 2021-03-23 DIAGNOSIS — O24415 Gestational diabetes mellitus in pregnancy, controlled by oral hypoglycemic drugs: Secondary | ICD-10-CM

## 2021-03-23 DIAGNOSIS — Z3A00 Weeks of gestation of pregnancy not specified: Secondary | ICD-10-CM | POA: Insufficient documentation

## 2021-03-23 DIAGNOSIS — O24419 Gestational diabetes mellitus in pregnancy, unspecified control: Secondary | ICD-10-CM

## 2021-03-23 NOTE — Progress Notes (Signed)

## 2021-03-23 NOTE — Progress Notes (Signed)
Spanish interpreter Homero Fellers [phone] from AMN video  Patient was seen for Gestational Diabetes self-management on 03/23/21  Start time 1125 and End time 1200  Estimated due date: 04/23/21; [redacted]w[redacted]d  Clinical: Medications: aspirin, prenatal, metformin 1000 mg bid Medical History: GDM 12 yrs ago Labs: OGTT 120-247-177; A1c 6.2% 11/03/20    Assessment:  Patient states she has switched the juice she was drinking to Cranberry Diet 5. This change doesn't seem to have made much difference.   At last MD visit last week her metformin was increased, now taking 1000 mg bid but doesn't seem to have made much difference. Pt states occasionally she forgets to take the second dose, but usually takes bid.  Per dietary recall it appears some meals are still close to 4-5 carb servings per meal.   At last visit patient states she would be hungry if she limited her rice to 1 cup and only 1 tortilla. RD advised patient that if she can reduce her carb intake and include more protein, she may be able to avoid the need for insulin which seemed to motivate her to make more changes.   Patient did not schedule follow-up visit when she checked out.   Physical Activity: 7x/week walking 30 min in the afternoon  Stress: not assessed Sleep: not assessed this visit   24 hr Recall: First Meal: 2 eggs, 1 slice of bread (usually, but sometimes has coffee and sweet bread) Snack: none Second meal: [place] sandwich with mayo, cheese, lettuce, onion (125 mg/dL) diet cranberry 5 Snack: Third meal: 3 sopas tortillas, cheese, cream and salsa Snack: Beverages: water, cranberry juice  The interpreter said that sopas are twice as thick as tortilla, probably something like this:   NUTRITION INTERVENTION  Nutrition education (E-1) on the following topics:   Initial Follow-up  [x]  []  Definition of Gestational Diabetes [x]  []  Why dietary management is important in controlling blood glucose [x]  []  Effects each nutrient has on  blood glucose levels []  []  Simple carbohydrates vs complex carbohydrates []  []  Fluid intake [x]  [x]  Creating a balanced meal plan []  [x]  Carbohydrate counting  [x]  []  When to check blood glucose levels [x]  []  Proper blood glucose monitoring techniques [x]  []  Effect of stress and stress reduction techniques  [x]  []  Exercise effect on blood glucose levels, appropriate exercise during pregnancy [x]  []  Importance of limiting caffeine and abstaining from alcohol and smoking [x]  []  Medications used for blood sugar control during pregnancy [x]  []  Hypoglycemia and rule of 15 [x]  []  Postpartum self care  Patient instructed to monitor glucose levels: FBS: 60 - ? 95 mg/dL (some clinics use 90 for cutoff) 1 hour: ? 140 mg/dL 2 hour: ? mg/dL  Patient received handouts: none  Patient will be seen in 2 weeks or as needed for follow-up.

## 2021-03-26 ENCOUNTER — Inpatient Hospital Stay (HOSPITAL_COMMUNITY)
Admission: AD | Admit: 2021-03-26 | Discharge: 2021-03-27 | Disposition: A | Discharge status: Home / Self Care | Payer: Self-pay | Attending: Obstetrics & Gynecology | Admitting: Obstetrics & Gynecology

## 2021-03-26 ENCOUNTER — Other Ambulatory Visit: Payer: Self-pay

## 2021-03-26 DIAGNOSIS — O4703 False labor before 37 completed weeks of gestation, third trimester: Secondary | ICD-10-CM | POA: Insufficient documentation

## 2021-03-26 DIAGNOSIS — O99213 Obesity complicating pregnancy, third trimester: Secondary | ICD-10-CM | POA: Insufficient documentation

## 2021-03-26 DIAGNOSIS — O368190 Decreased fetal movements, unspecified trimester, not applicable or unspecified: Secondary | ICD-10-CM

## 2021-03-26 DIAGNOSIS — O113 Pre-existing hypertension with pre-eclampsia, third trimester: Secondary | ICD-10-CM | POA: Insufficient documentation

## 2021-03-26 DIAGNOSIS — O09523 Supervision of elderly multigravida, third trimester: Secondary | ICD-10-CM | POA: Insufficient documentation

## 2021-03-26 DIAGNOSIS — N61 Mastitis without abscess: Secondary | ICD-10-CM

## 2021-03-26 DIAGNOSIS — Z3689 Encounter for other specified antenatal screening: Secondary | ICD-10-CM

## 2021-03-26 DIAGNOSIS — O91213 Nonpurulent mastitis associated with pregnancy, third trimester: Secondary | ICD-10-CM | POA: Insufficient documentation

## 2021-03-26 DIAGNOSIS — O35EXX0 Maternal care for other (suspected) fetal abnormality and damage, fetal genitourinary anomalies, not applicable or unspecified: Secondary | ICD-10-CM | POA: Insufficient documentation

## 2021-03-26 DIAGNOSIS — O10919 Unspecified pre-existing hypertension complicating pregnancy, unspecified trimester: Secondary | ICD-10-CM

## 2021-03-26 DIAGNOSIS — O43199 Other malformation of placenta, unspecified trimester: Secondary | ICD-10-CM

## 2021-03-26 DIAGNOSIS — Z7984 Long term (current) use of oral hypoglycemic drugs: Secondary | ICD-10-CM | POA: Insufficient documentation

## 2021-03-26 DIAGNOSIS — O0990 Supervision of high risk pregnancy, unspecified, unspecified trimester: Secondary | ICD-10-CM

## 2021-03-26 DIAGNOSIS — O99210 Obesity complicating pregnancy, unspecified trimester: Secondary | ICD-10-CM

## 2021-03-26 DIAGNOSIS — Z3A36 36 weeks gestation of pregnancy: Secondary | ICD-10-CM | POA: Insufficient documentation

## 2021-03-26 DIAGNOSIS — Z7982 Long term (current) use of aspirin: Secondary | ICD-10-CM | POA: Insufficient documentation

## 2021-03-26 DIAGNOSIS — O24415 Gestational diabetes mellitus in pregnancy, controlled by oral hypoglycemic drugs: Secondary | ICD-10-CM | POA: Insufficient documentation

## 2021-03-26 NOTE — MAU Note (Addendum)
Pt reports to MAU with c/o contractions that started this am.  She states that she feels her belly getting hard.  Pt also reports DFM- last movement was about an hour ago.  No LOF. Pt denies vaginal bleeding and discharge.  Pt reports fever however has not taken temp at home.  Pt states that she just feels hot.  This morning pt reports that she also has pain in right breast.

## 2021-03-27 ENCOUNTER — Encounter (HOSPITAL_COMMUNITY): Payer: Self-pay | Admitting: Obstetrics & Gynecology

## 2021-03-27 ENCOUNTER — Inpatient Hospital Stay (HOSPITAL_BASED_OUTPATIENT_CLINIC_OR_DEPARTMENT_OTHER): Payer: Self-pay

## 2021-03-27 DIAGNOSIS — Z3A36 36 weeks gestation of pregnancy: Secondary | ICD-10-CM

## 2021-03-27 DIAGNOSIS — O09293 Supervision of pregnancy with other poor reproductive or obstetric history, third trimester: Secondary | ICD-10-CM

## 2021-03-27 DIAGNOSIS — O10013 Pre-existing essential hypertension complicating pregnancy, third trimester: Secondary | ICD-10-CM

## 2021-03-27 DIAGNOSIS — O368130 Decreased fetal movements, third trimester, not applicable or unspecified: Secondary | ICD-10-CM

## 2021-03-27 DIAGNOSIS — O09523 Supervision of elderly multigravida, third trimester: Secondary | ICD-10-CM

## 2021-03-27 DIAGNOSIS — Z3689 Encounter for other specified antenatal screening: Secondary | ICD-10-CM

## 2021-03-27 DIAGNOSIS — O4703 False labor before 37 completed weeks of gestation, third trimester: Secondary | ICD-10-CM

## 2021-03-27 DIAGNOSIS — O24415 Gestational diabetes mellitus in pregnancy, controlled by oral hypoglycemic drugs: Secondary | ICD-10-CM

## 2021-03-27 DIAGNOSIS — O91213 Nonpurulent mastitis associated with pregnancy, third trimester: Secondary | ICD-10-CM

## 2021-03-27 LAB — URINALYSIS, ROUTINE W REFLEX MICROSCOPIC
Bilirubin Urine: NEGATIVE
Glucose, UA: 50 mg/dL — AB
Ketones, ur: 80 mg/dL — AB
Nitrite: NEGATIVE
Protein, ur: 30 mg/dL — AB
Specific Gravity, Urine: 1.016 (ref 1.005–1.030)
pH: 6 (ref 5.0–8.0)

## 2021-03-27 MED ORDER — LACTATED RINGERS IV BOLUS
1000.0000 mL | Freq: Once | INTRAVENOUS | Status: AC
  Administered 2021-03-27: 1000 mL via INTRAVENOUS

## 2021-03-27 MED ORDER — CEPHALEXIN 500 MG PO CAPS: 500.0000 mg | ORAL_CAPSULE | Freq: Four times a day (QID) | ORAL | 0 refills | Status: AC

## 2021-03-27 NOTE — MAU Provider Note (Signed)
History     CSN: [id number]  Arrival date and time: 03/26/21 2329   Event Date/Time   First Provider Initiated Contact with Patient 03/27/21 0029      Chief Complaint  Patient presents with   Decreased Fetal Movement   Contractions   40 y.o. [id number] @36 .1 wks presenting with decreased FM, ctx, and right breast pain. Reports onset of ctx yesterday am. Frequency is q10 min. Denies VB or LOF. Reports decreased FM since yesterday am. She is feeling movement but less. Reports leaking a brown milky substance from right breast and pain that started yesterday am. She also felt feverish but did not check her temp. Her pregnancy is complicated by AMA, A2GDM, CHTN, fetal pyelectasis, and obesity.   OB History     Gravida  5   Para  4   Term  4   Preterm  0   AB  0   Living  4      SAB  0   IAB  0   Ectopic  0   Multiple  0   Live Births  4           Past Medical History:  Diagnosis Date   Hypertension    Pregnancy induced hypertension     Past Surgical History:  Procedure Laterality Date   NO PAST SURGERIES      Family History  Problem Relation Age of Onset   Hypertension Mother    Diabetes Father    Diabetes Maternal Grandmother    Cancer Paternal Grandfather     Social History   Tobacco Use   Smoking status: Never   Smokeless tobacco: Never  Vaping Use   Vaping Use: Never used  Substance Use Topics   Alcohol use: No   Drug use: No    Allergies: No Known Allergies  Medications Prior to Admission  Medication Sig Dispense Refill Last Dose   aspirin EC 81 MG tablet Take 1 tablet (81 mg total) by mouth daily. Take after 12 weeks for prevention of preeclampsia later in pregnancy 300 tablet 2 03/26/2021   metFORMIN (GLUCOPHAGE) 1000 MG tablet Take 1 tablet (1,000 mg total) by mouth 2 (two) times daily with a meal. 60 tablet 3 03/26/2021   metFORMIN (GLUCOPHAGE) 500 MG tablet One tablet by mouth in the morning and 2 at night 120 tablet 0  03/26/2021   Prenatal Vit-Fe Fumarate-FA (PRENATAL PO) Take by mouth.   03/26/2021    Review of Systems  Constitutional:  Positive for fever.  Gastrointestinal:  Positive for abdominal pain (ctx).  Genitourinary:  Negative for vaginal bleeding and vaginal discharge.  Physical Exam   Blood pressure 135/89, pulse (!) 106, temperature 98.6 F (37 C), temperature source Oral, resp. rate 18, weight 92.4 kg, last menstrual period 07/26/2020, SpO2 96 %.  Physical Exam Vitals and nursing note reviewed. Exam conducted with a chaperone present.  Constitutional:      General: She is not in acute distress.    Appearance: Normal appearance.  HENT:     Head: Normocephalic and atraumatic.  Pulmonary:     Effort: Pulmonary effort is normal. No respiratory distress.  Chest:  Breasts:    Right: Swelling, skin change (erythema superior to areola) and tenderness present. No nipple discharge.     Left: Normal.  Abdominal:     Palpations: Abdomen is soft.     Tenderness: There is no abdominal tenderness.     Comments: gravid  Genitourinary:    Comments:  VE: 2/thick/ballotable Musculoskeletal:        General: Normal range of motion.     Cervical back: Normal range of motion.  Skin:    General: Skin is warm and dry.  Neurological:     General: No focal deficit present.     Mental Status: She is alert and oriented to person, place, and time.  Psychiatric:        Mood and Affect: Mood normal.        Behavior: Behavior normal.  EFM: 155 bpm, mod variability, + accels, rare variable decels Toco: irregular  Results for orders placed or performed during the hospital encounter of 03/26/21 (from the past 24 hour(s))  Urinalysis, Routine w reflex microscopic     Status: Abnormal   Collection Time: 03/27/21 12:04 AM  Result Value Ref Range   Color, Urine YELLOW YELLOW   APPearance HAZY (A) CLEAR   Specific Gravity, Urine 1.016 1.005 - 1.030   pH 6.0 5.0 - 8.0   Glucose, UA 50 (A) NEGATIVE mg/dL    Hgb urine dipstick SMALL (A) NEGATIVE   Bilirubin Urine NEGATIVE NEGATIVE   Ketones, ur 80 (A) NEGATIVE mg/dL   Protein, ur 30 (A) NEGATIVE mg/dL   Nitrite NEGATIVE NEGATIVE   Leukocytes,Ua TRACE (A) NEGATIVE   RBC / HPF 11-20 0 - 5 RBC/hpf   WBC, UA 6-10 0 - 5 WBC/hpf   Bacteria, UA FEW (A) NONE SEEN   Squamous Epithelial / LPF 6-10 0 - 5   Mucus PRESENT    Ca Oxalate Crys, UA PRESENT    MAU Course  Procedures  MDM Labs and BPP ordered. BPP 6/8 0235: Pt reports continued decreased FM. Consult with Dr. Despina Hidden, recommends IVF.  0430: NST reactive, pt feeling more FM. Consult with Dr. Despina Hidden, ok for discharge home.  Assessment and Plan  [redacted] weeks gestation Reactive NST Mastitis False labor Discharge home Follow up at [facility] tomorrow as scheduled PTL precautions [facility] Rx Keflex  Allergies as of 03/27/2021   No Known Allergies      Medication List     TAKE these medications    aspirin EC 81 MG tablet Take 1 tablet (81 mg total) by mouth daily. Take after 12 weeks for prevention of preeclampsia later in pregnancy   cephALEXin 500 MG capsule Commonly known as: KEFLEX Take 1 capsule (500 mg total) by mouth 4 (four) times daily for 7 days.   metFORMIN 500 MG tablet Commonly known as: Glucophage One tablet by mouth in the morning and 2 at night   metFORMIN 1000 MG tablet Commonly known as: Glucophage Take 1 tablet (1,000 mg total) by mouth 2 (two) times daily with a meal.   PRENATAL PO Take by mouth.       Video interpreter use for all interactions  Donette Larry, CNM 03/27/2021, 12:40 AM

## 2021-03-28 ENCOUNTER — Ambulatory Visit: Payer: Self-pay | Attending: Obstetrics

## 2021-03-28 ENCOUNTER — Ambulatory Visit (HOSPITAL_BASED_OUTPATIENT_CLINIC_OR_DEPARTMENT_OTHER): Payer: Self-pay | Admitting: *Deleted

## 2021-03-28 ENCOUNTER — Encounter: Payer: Self-pay | Admitting: *Deleted

## 2021-03-28 ENCOUNTER — Other Ambulatory Visit: Payer: Self-pay | Admitting: Obstetrics

## 2021-03-28 ENCOUNTER — Ambulatory Visit: Payer: Self-pay | Admitting: *Deleted

## 2021-03-28 ENCOUNTER — Other Ambulatory Visit: Payer: Self-pay

## 2021-03-28 VITALS — BP 120/75 | HR 85

## 2021-03-28 DIAGNOSIS — O35EXX0 Maternal care for other (suspected) fetal abnormality and damage, fetal genitourinary anomalies, not applicable or unspecified: Secondary | ICD-10-CM

## 2021-03-28 DIAGNOSIS — O10919 Unspecified pre-existing hypertension complicating pregnancy, unspecified trimester: Secondary | ICD-10-CM | POA: Insufficient documentation

## 2021-03-28 DIAGNOSIS — O09523 Supervision of elderly multigravida, third trimester: Secondary | ICD-10-CM

## 2021-03-28 DIAGNOSIS — O10913 Unspecified pre-existing hypertension complicating pregnancy, third trimester: Secondary | ICD-10-CM

## 2021-03-28 DIAGNOSIS — O99210 Obesity complicating pregnancy, unspecified trimester: Secondary | ICD-10-CM

## 2021-03-28 DIAGNOSIS — O24419 Gestational diabetes mellitus in pregnancy, unspecified control: Secondary | ICD-10-CM | POA: Insufficient documentation

## 2021-03-28 DIAGNOSIS — O0990 Supervision of high risk pregnancy, unspecified, unspecified trimester: Secondary | ICD-10-CM

## 2021-03-28 DIAGNOSIS — O43199 Other malformation of placenta, unspecified trimester: Secondary | ICD-10-CM | POA: Insufficient documentation

## 2021-03-28 DIAGNOSIS — O10013 Pre-existing essential hypertension complicating pregnancy, third trimester: Secondary | ICD-10-CM

## 2021-03-28 DIAGNOSIS — Z3A36 36 weeks gestation of pregnancy: Secondary | ICD-10-CM

## 2021-03-28 NOTE — Procedures (Signed)
Virginia Dyer November 11, 1980 [redacted]w[redacted]d  Fetus A Non-Stress Test Interpretation for 03/28/21  Indication: Diabetes, BPP 6/8 over the weekend, 03/28/21 BPP normal  Fetal Heart Rate A Mode: External Baseline Rate (A): 130 bpm Variability: Moderate Accelerations: 15 x 15 Decelerations: None Multiple birth?: No  Uterine Activity Mode: Palpation, Toco Contraction Frequency (min): 3 uc's with ui Contraction Duration (sec): 120-140 Contraction Quality: Mild Resting Tone Palpated: Relaxed Resting Time: Adequate  Interpretation (Fetal Testing) Nonstress Test Interpretation: Reactive Overall Impression: Reassuring for gestational age Comments: Dr. Grace Bushy reviewed tracing.

## 2021-04-05 ENCOUNTER — Ambulatory Visit (INDEPENDENT_AMBULATORY_CARE_PROVIDER_SITE_OTHER): Payer: Self-pay | Admitting: Obstetrics & Gynecology

## 2021-04-05 ENCOUNTER — Encounter: Payer: Self-pay | Admitting: Obstetrics & Gynecology

## 2021-04-05 ENCOUNTER — Other Ambulatory Visit: Payer: Self-pay

## 2021-04-05 ENCOUNTER — Other Ambulatory Visit: Payer: Self-pay | Admitting: Advanced Practice Midwife

## 2021-04-05 ENCOUNTER — Ambulatory Visit (INDEPENDENT_AMBULATORY_CARE_PROVIDER_SITE_OTHER): Payer: Self-pay

## 2021-04-05 ENCOUNTER — Ambulatory Visit: Payer: Self-pay | Admitting: *Deleted

## 2021-04-05 VITALS — BP 134/82 | HR 86 | Wt 199.7 lb

## 2021-04-05 DIAGNOSIS — O24415 Gestational diabetes mellitus in pregnancy, controlled by oral hypoglycemic drugs: Secondary | ICD-10-CM

## 2021-04-05 DIAGNOSIS — O09523 Supervision of elderly multigravida, third trimester: Secondary | ICD-10-CM

## 2021-04-05 DIAGNOSIS — O43199 Other malformation of placenta, unspecified trimester: Secondary | ICD-10-CM

## 2021-04-05 DIAGNOSIS — O99210 Obesity complicating pregnancy, unspecified trimester: Secondary | ICD-10-CM

## 2021-04-05 DIAGNOSIS — O35EXX0 Maternal care for other (suspected) fetal abnormality and damage, fetal genitourinary anomalies, not applicable or unspecified: Secondary | ICD-10-CM

## 2021-04-05 DIAGNOSIS — O10919 Unspecified pre-existing hypertension complicating pregnancy, unspecified trimester: Secondary | ICD-10-CM

## 2021-04-05 DIAGNOSIS — O24419 Gestational diabetes mellitus in pregnancy, unspecified control: Secondary | ICD-10-CM

## 2021-04-05 DIAGNOSIS — O0990 Supervision of high risk pregnancy, unspecified, unspecified trimester: Secondary | ICD-10-CM

## 2021-04-05 NOTE — Progress Notes (Signed)
Pt had [place] for growth on 10/18.

## 2021-04-05 NOTE — Progress Notes (Signed)
   PRENATAL VISIT NOTE  Subjective:  Virginia Dyer is a 40 y.o. G5P4004 at [redacted]w[redacted]d being seen today for ongoing prenatal care.  She is currently monitored for the following issues for this high-risk pregnancy and has Hypertension; Supervision of high risk pregnancy, antepartum; Advanced maternal age in multigravida; History of gestational diabetes in prior pregnancy, currently pregnant; History of pre-eclampsia in prior pregnancy, currently pregnant; Chronic hypertension affecting pregnancy; Obesity during pregnancy, antepartum; Fetal renal anomaly, single gestation; Placenta succenturiata, antepartum; [redacted] weeks gestation of pregnancy; and Gestational diabetes mellitus (GDM), antepartum on their problem list.  Patient reports occasional contractions.  Contractions: Irritability. Vag. Bleeding: None.  Movement: Present. Denies leaking of fluid.   The following portions of the patient's history were reviewed and updated as appropriate: allergies, current medications, past family history, past medical history, past social history, past surgical history and problem list.   Objective:   Vitals:   04/05/21 1028 04/05/21 1057  BP: 140/87 134/82  Pulse: 86   Weight: 199 lb 11.2 oz (90.6 kg)     Fetal Status: Fetal Heart Rate (bpm): RNST   Movement: Present     General:  Alert, oriented and cooperative. Patient is in no acute distress.  Skin: Skin is warm and dry. No rash noted.   Cardiovascular: Normal heart rate noted  Respiratory: Normal respiratory effort, no problems with respiration noted  Abdomen: Soft, gravid, appropriate for gestational age.  Pain/Pressure: Present     Pelvic: Cervical exam deferred        Extremities: Normal range of motion.  Edema: None  Mental Status: Normal mood and affect. Normal behavior. Normal judgment and thought content.   Assessment and Plan:  Pregnancy: G5P4004 at [redacted]w[redacted]d 1. Fetal renal anomaly, single gestation   2. Chronic hypertension affecting  pregnancy BP stable  3. Obesity during pregnancy, antepartum Body mass index is 37.73 kg/m.   4. Supervision of high risk pregnancy, antepartum GDM good control on metformin  5. Multigravida of advanced maternal age in third trimester 46 week IOL  6. Placenta succenturiata, antepartum Advise in L&D at delivery  Term labor symptoms and general obstetric precautions including but not limited to vaginal bleeding, contractions, leaking of fluid and fetal movement were reviewed in detail with the patient. Please refer to After Visit Summary for other counseling recommendations.   Return in about 1 week (around 04/12/2021) for NST BPP.  Future Appointments  Date Time Provider Department Center  04/12/2021  1:35 PM Milas Hock, MD [facility]  04/12/2021  3:15 PM [facility] NST [facility]  04/19/2021  2:35 PM Venora Maples, MD [facility]  04/19/2021  3:15 PM [facility] NST [facility]    Scheryl Darter, MD

## 2021-04-09 ENCOUNTER — Other Ambulatory Visit: Payer: Self-pay | Admitting: Family Medicine

## 2021-04-11 ENCOUNTER — Other Ambulatory Visit: Payer: Self-pay | Admitting: Advanced Practice Midwife

## 2021-04-12 ENCOUNTER — Ambulatory Visit: Payer: Self-pay | Admitting: *Deleted

## 2021-04-12 ENCOUNTER — Other Ambulatory Visit: Payer: Self-pay

## 2021-04-12 ENCOUNTER — Ambulatory Visit (INDEPENDENT_AMBULATORY_CARE_PROVIDER_SITE_OTHER): Payer: Self-pay | Admitting: Obstetrics and Gynecology

## 2021-04-12 ENCOUNTER — Other Ambulatory Visit (HOSPITAL_COMMUNITY)
Admission: RE | Admit: 2021-04-12 | Discharge: 2021-04-12 | Disposition: A | Discharge status: Home / Self Care | Payer: Self-pay | Source: Ambulatory Visit | Attending: Obstetrics and Gynecology | Admitting: Obstetrics and Gynecology

## 2021-04-12 ENCOUNTER — Ambulatory Visit (INDEPENDENT_AMBULATORY_CARE_PROVIDER_SITE_OTHER): Payer: Self-pay

## 2021-04-12 VITALS — BP 131/80 | HR 81 | Wt 202.2 lb

## 2021-04-12 DIAGNOSIS — O09523 Supervision of elderly multigravida, third trimester: Secondary | ICD-10-CM

## 2021-04-12 DIAGNOSIS — O0990 Supervision of high risk pregnancy, unspecified, unspecified trimester: Secondary | ICD-10-CM | POA: Insufficient documentation

## 2021-04-12 DIAGNOSIS — O24415 Gestational diabetes mellitus in pregnancy, controlled by oral hypoglycemic drugs: Secondary | ICD-10-CM

## 2021-04-12 DIAGNOSIS — O10919 Unspecified pre-existing hypertension complicating pregnancy, unspecified trimester: Secondary | ICD-10-CM

## 2021-04-12 LAB — OB RESULTS CONSOLE GC/CHLAMYDIA: Gonorrhea: NEGATIVE

## 2021-04-12 NOTE — Progress Notes (Signed)
   PRENATAL VISIT NOTE  Subjective:  Virginia Dyer is a 40 y.o. G5P4004 at [redacted]w[redacted]d being seen today for ongoing prenatal care.  She is currently monitored for the following issues for this high-risk pregnancy and has Hypertension; Supervision of high risk pregnancy, antepartum; Advanced maternal age in multigravida; History of gestational diabetes in prior pregnancy, currently pregnant; History of pre-eclampsia in prior pregnancy, currently pregnant; Chronic hypertension affecting pregnancy; Obesity during pregnancy, antepartum; Fetal renal anomaly, single gestation; Placenta succenturiata, antepartum; and Gestational diabetes mellitus (GDM), antepartum on their problem list.  Patient reports no complaints.  Contractions: Irritability. Vag. Bleeding: None.  Movement: Present. Denies leaking of fluid.   The following portions of the patient's history were reviewed and updated as appropriate: allergies, current medications, past family history, past medical history, past social history, past surgical history and problem list.   Objective:   Vitals:   04/12/21 1356  BP: 131/80  Pulse: 81  Weight: 202 lb 3.2 oz (91.7 kg)    Fetal Status: Fetal Heart Rate (bpm): 138 Fundal Height: 40 cm Movement: Present  Presentation: Vertex  General:  Alert, oriented and cooperative. Patient is in no acute distress.  Skin: Skin is warm and dry. No rash noted.   Cardiovascular: Normal heart rate noted  Respiratory: Normal respiratory effort, no problems with respiration noted  Abdomen: Soft, gravid, appropriate for gestational age.  Pain/Pressure: Present     Pelvic: Cervical exam performed in the presence of a chaperone Dilation: 3 Effacement (%): 60 Station: -2  Extremities: Normal range of motion.  Edema: None  Mental Status: Normal mood and affect. Normal behavior. Normal judgment and thought content.   Assessment and Plan:  Pregnancy: G5P4004 at [redacted]w[redacted]d 1. Supervision of high risk pregnancy,  antepartum - Discussed process of IOL given cervical exam is 3 cm. She plans to have epidural  - Culture, beta strep (group b only) - GC/Chlamydia probe amp ([place])not at [facility]  2. Multigravida of advanced maternal age in third trimester - Normal anatomy scan - Normal NIPS  3. Gestational diabetes mellitus (GDM) controlled on oral hypoglycemic drug, antepartum - CBGs showed fastings in the low 100s since increasing metformin to 1000 BID. Her meals are mostly around 120s.  - Based on [place] finding of macrosomia (97%ile, HC 36, AC 41, normal AFI), I recommended delivery at 39w especially in light of AMA at 40 and CHTN (no meds). She accepts this and we discussed 11/6. Form submitted to L&D  4. Chronic hypertension affecting pregnancy - NO meds, continue ldASA until delivery - BP normal today  Term labor symptoms and general obstetric precautions including but not limited to vaginal bleeding, contractions, leaking of fluid and fetal movement were reviewed in detail with the patient. Please refer to After Visit Summary for other counseling recommendations.   Return in about 1 week (around 04/19/2021) for For induction of labor (most likely 11/6).  Future Appointments  Date Time Provider Department Center  04/12/2021  3:15 PM [facility] NST [facility]  04/16/2021  6:30 AM [facility] SCHED ROOM [facility] None    Milas Hock, MD

## 2021-04-12 NOTE — Progress Notes (Signed)
IOL scheduled for November 6th am.  Pt notified with Spanish Interpreter.  Honest Vanleer,RN  04/12/21

## 2021-04-13 ENCOUNTER — Telehealth (HOSPITAL_COMMUNITY): Payer: Self-pay | Admitting: *Deleted

## 2021-04-13 LAB — GC/CHLAMYDIA PROBE AMP (~~LOC~~) NOT AT ARMC
Chlamydia: NEGATIVE
Comment: NEGATIVE
Comment: NORMAL
Neisseria Gonorrhea: NEGATIVE

## 2021-04-13 LAB — OB RESULTS CONSOLE GC/CHLAMYDIA
Gonorrhea: NEGATIVE
Gonorrhea: NEGATIVE

## 2021-04-13 NOTE — Telephone Encounter (Incomplete)
Preadmission screen Interpreter number  

## 2021-04-15 LAB — CULTURE, BETA STREP (GROUP B ONLY): Strep Gp B Culture: NEGATIVE

## 2021-04-16 ENCOUNTER — Encounter (HOSPITAL_COMMUNITY): Payer: Self-pay | Admitting: Obstetrics and Gynecology

## 2021-04-16 ENCOUNTER — Inpatient Hospital Stay (HOSPITAL_COMMUNITY): Payer: Medicaid Other | Admitting: Anesthesiology

## 2021-04-16 ENCOUNTER — Inpatient Hospital Stay (HOSPITAL_COMMUNITY): Payer: Medicaid Other

## 2021-04-16 ENCOUNTER — Other Ambulatory Visit: Payer: Self-pay

## 2021-04-16 ENCOUNTER — Inpatient Hospital Stay (HOSPITAL_COMMUNITY)
Admission: AD | Admit: 2021-04-16 | Discharge: 2021-04-18 | DRG: 807 | Disposition: A | Discharge status: Home / Self Care | Payer: Medicaid Other | Attending: Obstetrics and Gynecology | Admitting: Obstetrics and Gynecology

## 2021-04-16 DIAGNOSIS — O0990 Supervision of high risk pregnancy, unspecified, unspecified trimester: Secondary | ICD-10-CM

## 2021-04-16 DIAGNOSIS — O99214 Obesity complicating childbirth: Secondary | ICD-10-CM | POA: Diagnosis present

## 2021-04-16 DIAGNOSIS — O3663X0 Maternal care for excessive fetal growth, third trimester, not applicable or unspecified: Secondary | ICD-10-CM | POA: Diagnosis present

## 2021-04-16 DIAGNOSIS — O09529 Supervision of elderly multigravida, unspecified trimester: Secondary | ICD-10-CM

## 2021-04-16 DIAGNOSIS — Z20822 Contact with and (suspected) exposure to covid-19: Secondary | ICD-10-CM | POA: Diagnosis present

## 2021-04-16 DIAGNOSIS — Z8632 Personal history of gestational diabetes: Secondary | ICD-10-CM

## 2021-04-16 DIAGNOSIS — O24425 Gestational diabetes mellitus in childbirth, controlled by oral hypoglycemic drugs: Secondary | ICD-10-CM | POA: Diagnosis present

## 2021-04-16 DIAGNOSIS — O99210 Obesity complicating pregnancy, unspecified trimester: Secondary | ICD-10-CM | POA: Diagnosis present

## 2021-04-16 DIAGNOSIS — Z3A39 39 weeks gestation of pregnancy: Secondary | ICD-10-CM

## 2021-04-16 DIAGNOSIS — O1002 Pre-existing essential hypertension complicating childbirth: Secondary | ICD-10-CM | POA: Diagnosis present

## 2021-04-16 DIAGNOSIS — O09299 Supervision of pregnancy with other poor reproductive or obstetric history, unspecified trimester: Secondary | ICD-10-CM

## 2021-04-16 DIAGNOSIS — I10 Essential (primary) hypertension: Secondary | ICD-10-CM | POA: Diagnosis present

## 2021-04-16 DIAGNOSIS — O24419 Gestational diabetes mellitus in pregnancy, unspecified control: Secondary | ICD-10-CM

## 2021-04-16 DIAGNOSIS — O119 Pre-existing hypertension with pre-eclampsia, unspecified trimester: Secondary | ICD-10-CM

## 2021-04-16 DIAGNOSIS — Z880 Allergy status to penicillin: Secondary | ICD-10-CM

## 2021-04-16 DIAGNOSIS — O114 Pre-existing hypertension with pre-eclampsia, complicating childbirth: Secondary | ICD-10-CM | POA: Diagnosis present

## 2021-04-16 DIAGNOSIS — O1414 Severe pre-eclampsia complicating childbirth: Secondary | ICD-10-CM

## 2021-04-16 DIAGNOSIS — O24424 Gestational diabetes mellitus in childbirth, insulin controlled: Secondary | ICD-10-CM | POA: Diagnosis not present

## 2021-04-16 DIAGNOSIS — O43199 Other malformation of placenta, unspecified trimester: Secondary | ICD-10-CM | POA: Diagnosis present

## 2021-04-16 HISTORY — DX: Type 2 diabetes mellitus without complications: E11.9

## 2021-04-16 LAB — CBC
HCT: 37.8 % (ref 36.0–46.0)
HCT: 37 % (ref 36.0–46.0)
HCT: 31.5 % — ABNORMAL LOW (ref 36.0–46.0)
Hemoglobin: 13 g/dL (ref 12.0–15.0)
Hemoglobin: 12.6 g/dL (ref 12.0–15.0)
Hemoglobin: 11.2 g/dL — ABNORMAL LOW (ref 12.0–15.0)
MCH: 31.2 pg (ref 26.0–34.0)
MCH: 31.2 pg (ref 26.0–34.0)
MCH: 32.1 pg (ref 26.0–34.0)
MCHC: 34.4 g/dL (ref 30.0–36.0)
MCHC: 34.1 g/dL (ref 30.0–36.0)
MCHC: 35.6 g/dL (ref 30.0–36.0)
MCV: 90.6 fL (ref 80.0–100.0)
MCV: 91.6 fL (ref 80.0–100.0)
MCV: 90.3 fL (ref 80.0–100.0)
Platelets: 197 10*3/uL (ref 150–400)
Platelets: 187 10*3/uL (ref 150–400)
Platelets: 154 10*3/uL (ref 150–400)
RBC: 4.17 MIL/uL (ref 3.87–5.11)
RBC: 4.04 MIL/uL (ref 3.87–5.11)
RBC: 3.49 MIL/uL — ABNORMAL LOW (ref 3.87–5.11)
RDW: 13.6 % (ref 11.5–15.5)
RDW: 13.6 % (ref 11.5–15.5)
RDW: 13.5 % (ref 11.5–15.5)
WBC: 7.6 10*3/uL (ref 4.0–10.5)
WBC: 8.4 10*3/uL (ref 4.0–10.5)
WBC: 18.6 10*3/uL — ABNORMAL HIGH (ref 4.0–10.5)
nRBC: 0 % (ref 0.0–0.2)
nRBC: 0 % (ref 0.0–0.2)
nRBC: 0 % (ref 0.0–0.2)

## 2021-04-16 LAB — PROTEIN / CREATININE RATIO, URINE
Creatinine, Urine: 78.84 mg/dL
Protein Creatinine Ratio: 0.18 mg/mg{Cre} — ABNORMAL HIGH (ref 0.00–0.15)
Total Protein, Urine: 14 mg/dL

## 2021-04-16 LAB — TYPE AND SCREEN
ABO/RH(D): O POS
Antibody Screen: NEGATIVE

## 2021-04-16 LAB — RESP PANEL BY RT-PCR (FLU A&B, COVID) ARPGX2
Influenza A by PCR: NEGATIVE
Influenza B by PCR: NEGATIVE
SARS Coronavirus 2 by RT PCR: NEGATIVE

## 2021-04-16 LAB — COMPREHENSIVE METABOLIC PANEL
ALT: 19 U/L (ref 0–44)
AST: 26 U/L (ref 15–41)
Albumin: 2.8 g/dL — ABNORMAL LOW (ref 3.5–5.0)
Alkaline Phosphatase: 111 U/L (ref 38–126)
Anion gap: 9 (ref 5–15)
BUN: 8 mg/dL (ref 6–20)
CO2: 21 mmol/L — ABNORMAL LOW (ref 22–32)
Calcium: 9.1 mg/dL (ref 8.9–10.3)
Chloride: 106 mmol/L (ref 98–111)
Creatinine, Ser: 0.52 mg/dL (ref 0.44–1.00)
GFR, Estimated: >60 mL/min (ref >60)
Glucose, Bld: 90 mg/dL (ref 70–99)
Potassium: 3.9 mmol/L (ref 3.5–5.1)
Sodium: 136 mmol/L (ref 135–145)
Total Bilirubin: 0.6 mg/dL (ref 0.3–1.2)
Total Protein: 5.9 g/dL — ABNORMAL LOW (ref 6.5–8.1)

## 2021-04-16 LAB — GLUCOSE, CAPILLARY
Glucose-Capillary: 96 mg/dL (ref 70–99)
Glucose-Capillary: 92 mg/dL (ref 70–99)
Glucose-Capillary: 82 mg/dL (ref 70–99)
Glucose-Capillary: 103 mg/dL — ABNORMAL HIGH (ref 70–99)

## 2021-04-16 MED ORDER — CEFAZOLIN SODIUM-DEXTROSE 1-4 GM/50ML-% IV SOLN
1.0000 g | Freq: Once | INTRAVENOUS | Status: AC
  Administered 2021-04-17: 1 g via INTRAVENOUS
  Filled 2021-04-16: qty 50

## 2021-04-16 MED ORDER — FENTANYL CITRATE (PF) 100 MCG/2ML IJ SOLN
100.0000 ug | INTRAMUSCULAR | Status: DC | PRN
  Administered 2021-04-16: 100 ug via INTRAVENOUS
  Filled 2021-04-16: qty 2

## 2021-04-16 MED ORDER — HYDRALAZINE HCL 20 MG/ML IJ SOLN: 10.0000 mg | INTRAMUSCULAR | Status: DC | PRN

## 2021-04-16 MED ORDER — MAGNESIUM SULFATE BOLUS VIA INFUSION
4.0000 g | Freq: Once | INTRAVENOUS | Status: AC
  Administered 2021-04-16: 4 g via INTRAVENOUS
  Filled 2021-04-16: qty 1000

## 2021-04-16 MED ORDER — LABETALOL HCL 5 MG/ML IV SOLN
20.0000 mg | INTRAVENOUS | Status: DC | PRN
  Administered 2021-04-16 (×2): 20 mg via INTRAVENOUS
  Filled 2021-04-16 (×2): qty 4

## 2021-04-16 MED ORDER — MISOPROSTOL 200 MCG PO TABS: 1000.0000 ug | ORAL_TABLET | Freq: Once | ORAL | Status: AC

## 2021-04-16 MED ORDER — LIDOCAINE HCL (PF) 1 % IJ SOLN
INTRAMUSCULAR | Status: DC | PRN
  Administered 2021-04-16: 5 mL via EPIDURAL

## 2021-04-16 MED ORDER — LACTATED RINGERS IV SOLN: INTRAVENOUS | Status: DC

## 2021-04-16 MED ORDER — LABETALOL HCL 5 MG/ML IV SOLN: 80.0000 mg | INTRAVENOUS | Status: DC | PRN

## 2021-04-16 MED ORDER — OXYTOCIN-SODIUM CHLORIDE 30-0.9 UT/500ML-% IV SOLN
1.0000 m[IU]/min | INTRAVENOUS | Status: DC
  Administered 2021-04-16: 2 m[IU]/min via INTRAVENOUS

## 2021-04-16 MED ORDER — METHYLERGONOVINE MALEATE 0.2 MG/ML IJ SOLN: 0.2000 mg | Freq: Once | INTRAMUSCULAR | Status: AC

## 2021-04-16 MED ORDER — PHENYLEPHRINE 40 MCG/ML (10ML) SYRINGE FOR IV PUSH (FOR BLOOD PRESSURE SUPPORT): 80.0000 ug | PREFILLED_SYRINGE | INTRAVENOUS | Status: DC | PRN

## 2021-04-16 MED ORDER — FENTANYL-BUPIVACAINE-NACL 0.5-0.125-0.9 MG/250ML-% EP SOLN
12.0000 mL/h | EPIDURAL | Status: DC | PRN
  Administered 2021-04-16: 12 mL/h via EPIDURAL
  Filled 2021-04-16: qty 250

## 2021-04-16 MED ORDER — ACETAMINOPHEN 325 MG PO TABS: 650.0000 mg | ORAL_TABLET | ORAL | Status: DC | PRN

## 2021-04-16 MED ORDER — LACTATED RINGERS IV SOLN: 500.0000 mL | INTRAVENOUS | Status: DC | PRN

## 2021-04-16 MED ORDER — METFORMIN HCL 500 MG PO TABS
1000.0000 mg | ORAL_TABLET | Freq: Once | ORAL | Status: AC
  Administered 2021-04-16: 1000 mg via ORAL
  Filled 2021-04-16: qty 2

## 2021-04-16 MED ORDER — MISOPROSTOL 25 MCG QUARTER TABLET
25.0000 ug | ORAL_TABLET | ORAL | Status: DC | PRN
  Administered 2021-04-16: 25 ug via VAGINAL
  Filled 2021-04-16: qty 1

## 2021-04-16 MED ORDER — OXYCODONE-ACETAMINOPHEN 5-325 MG PO TABS: 1.0000 | ORAL_TABLET | ORAL | Status: DC | PRN

## 2021-04-16 MED ORDER — TRANEXAMIC ACID-NACL 1000-0.7 MG/100ML-% IV SOLN
INTRAVENOUS | Status: AC
  Filled 2021-04-16: qty 100

## 2021-04-16 MED ORDER — OXYTOCIN-SODIUM CHLORIDE 30-0.9 UT/500ML-% IV SOLN
2.5000 [IU]/h | INTRAVENOUS | Status: DC
  Administered 2021-04-16: 2.5 [IU]/h via INTRAVENOUS
  Filled 2021-04-16: qty 500

## 2021-04-16 MED ORDER — LABETALOL HCL 5 MG/ML IV SOLN
40.0000 mg | INTRAVENOUS | Status: DC | PRN
  Administered 2021-04-16: 40 mg via INTRAVENOUS
  Filled 2021-04-16: qty 8

## 2021-04-16 MED ORDER — TERBUTALINE SULFATE 1 MG/ML IJ SOLN: 0.2500 mg | Freq: Once | INTRAMUSCULAR | Status: DC | PRN

## 2021-04-16 MED ORDER — LIDOCAINE HCL (PF) 1 % IJ SOLN: 30.0000 mL | INTRAMUSCULAR | Status: DC | PRN

## 2021-04-16 MED ORDER — OXYCODONE-ACETAMINOPHEN 5-325 MG PO TABS: 2.0000 | ORAL_TABLET | ORAL | Status: DC | PRN

## 2021-04-16 MED ORDER — OXYTOCIN BOLUS FROM INFUSION
333.0000 mL | Freq: Once | INTRAVENOUS | Status: AC
  Administered 2021-04-16: 333 mL via INTRAVENOUS

## 2021-04-16 MED ORDER — METHYLERGONOVINE MALEATE 0.2 MG/ML IJ SOLN
INTRAMUSCULAR | Status: AC
  Administered 2021-04-16: 0.2 mg via INTRAMUSCULAR
  Filled 2021-04-16: qty 1

## 2021-04-16 MED ORDER — MAGNESIUM SULFATE 40 GM/1000ML IV SOLN
2.0000 g/h | INTRAVENOUS | Status: DC
  Filled 2021-04-16: qty 1000

## 2021-04-16 MED ORDER — MISOPROSTOL 200 MCG PO TABS
ORAL_TABLET | ORAL | Status: AC
  Filled 2021-04-16: qty 1

## 2021-04-16 MED ORDER — LACTATED RINGERS IV SOLN: 500.0000 mL | Freq: Once | INTRAVENOUS | Status: DC

## 2021-04-16 MED ORDER — EPHEDRINE 5 MG/ML INJ: 10.0000 mg | INTRAVENOUS | Status: DC | PRN

## 2021-04-16 MED ORDER — FUROSEMIDE 10 MG/ML IJ SOLN
10.0000 mg | Freq: Two times a day (BID) | INTRAMUSCULAR | Status: AC
  Administered 2021-04-17 (×2): 10 mg via INTRAVENOUS
  Filled 2021-04-16 (×2): qty 2

## 2021-04-16 MED ORDER — DIPHENHYDRAMINE HCL 50 MG/ML IJ SOLN: 12.5000 mg | INTRAMUSCULAR | Status: DC | PRN

## 2021-04-16 MED ORDER — MISOPROSTOL 200 MCG PO TABS
ORAL_TABLET | ORAL | Status: AC
  Administered 2021-04-16: 1000 ug via VAGINAL
  Filled 2021-04-16: qty 5

## 2021-04-16 MED ORDER — ONDANSETRON HCL 4 MG/2ML IJ SOLN
4.0000 mg | Freq: Four times a day (QID) | INTRAMUSCULAR | Status: DC | PRN
  Administered 2021-04-16: 4 mg via INTRAVENOUS
  Filled 2021-04-16: qty 2

## 2021-04-16 MED ORDER — SOD CITRATE-CITRIC ACID 500-334 MG/5ML PO SOLN: 30.0000 mL | ORAL | Status: DC | PRN

## 2021-04-16 MED ORDER — TRANEXAMIC ACID-NACL 1000-0.7 MG/100ML-% IV SOLN
1000.0000 mg | INTRAVENOUS | Status: AC
  Administered 2021-04-16: 1000 mg via INTRAVENOUS

## 2021-04-16 NOTE — Progress Notes (Signed)
Patient ID: Virginia Dyer, female   DOB: [birth year], 40 y.o.   MRN: [id number] Pt had severe range BP's while getting epidural. Very nervous. Repeat BP 141/85, 146/84. Will not give Labetalol at this time. Will CTO closely. If pt has any more valid severe-range BP's, will Dx severe Pre-E and start Mag Sulfate.  Patient Vitals for the past 24 hrs:  BP Temp Temp src Pulse Resp SpO2 Height Weight  04/16/21 1435 (!) 147/86 -- -- 74 -- 98 % -- --  04/16/21 1430 (!) 146/84 -- -- 79 -- 98 % -- --  04/16/21 1425 (!) 151/85 -- -- 80 -- 99 % -- --  04/16/21 1421 (!) 168/88 -- -- 88 -- -- -- --  04/16/21 1420 -- -- -- -- -- 99 % -- --  04/16/21 1417 (!) 173/103 -- -- 90 -- -- -- --  04/16/21 1416 (!) 185/110 -- -- 95 -- -- -- --    Katrinka Blazing, [place], CNM 04/16/2021 2:38 PM

## 2021-04-16 NOTE — Anesthesia Procedure Notes (Signed)
Epidural Patient location during procedure: OB Start time: 04/16/2021 2:07 PM End time: 04/16/2021 2:17 PM  Staffing Anesthesiologist: Leonides Grills, MD Performed: anesthesiologist   Preanesthetic Checklist Completed: patient identified, IV checked, site marked, risks and benefits discussed, monitors and equipment checked, pre-op evaluation and timeout performed  Epidural Patient position: sitting Prep: DuraPrep Patient monitoring: heart rate, cardiac monitor, continuous pulse ox and blood pressure Approach: midline Location: L4-L5 Injection technique: LOR air  Needle:  Needle type: Tuohy  Needle gauge: 17 G Needle length: 9 cm Needle insertion depth: 7 cm Catheter type: closed end flexible Catheter size: 19 Gauge Catheter at skin depth: 13 cm Test dose: negative and 1.5% lidocaine with Epi 1:200 K  Assessment Events: blood not aspirated, injection not painful, no injection resistance and negative IV test  Additional Notes Informed consent obtained prior to proceeding including risk of failure, 1% risk of PDPH, risk of minor discomfort and bruising.  Discussed alternatives to epidural analgesia and patient desires to proceed.  Timeout performed pre-procedure verifying patient name, procedure, and platelet count.  Patient tolerated procedure well. Reason for block:procedure for pain

## 2021-04-16 NOTE — H&P (Addendum)
OB ADMISSION/ HISTORY & PHYSICAL:  Admission Date: 04/16/2021  6:51 AM  Admit Diagnosis: Gestational diabetes mellitus, class A2 [O24.419]    Virginia Dyer is a 40 y.o. female at [redacted]w[redacted]d presenting for IOL for Chronic HTN on no meds and Gestational Diabetes Mellitus on Metformin 1,000 mg BID. Patient also maternal age and multigravida. Patient reports no complaints, mild contractions, no vaginal bleeding, no leaking of fluid, and fetal movement present. No PEC symptoms.   Nursing Staff Provider  Office Location [facility] Dating  LMP  Language  Spanish Anatomy US  Renal pyelactasis, succinurate placental lobe. Otherwise WNL  Flu Vaccine  Given Genetic/Carrier Screen  NIPS:   normal AFP:    Horizon:  TDaP Vaccine    Hgb A1C or  GTT Early  Third trimester : failed 2 hour GTT  COVID Vaccine    LAB RESULTS   Rhogam   Blood Type O/Positive/-- (05/26 1643)   Baby Feeding Plan Both Antibody Negative (05/26 1643)  Contraception Depo>IUD (at [facility]) Rubella 11.20 (05/26 1643)  Circumcision N/a RPR Non Reactive (08/18 0911)   Pediatrician  [facility] HBsAg Negative (05/26 1643)   Support Person Marland Kitchen (FOB/husband) HCVAb negative  Prenatal Classes  HIV Non Reactive (08/18 0911)     BTL Consent NA GBS   (For PCN allergy, check sensitivities)   VBAC Consent NA Pap        BP Cuff Given 11/03/20 Waterbirth  [ ]  Class [ ]  Consent [ ]  CNM visit  PHQ9 & GAD7 [x]  new OB [  ] 28 weeks  [  ] 36 weeks Induction  [ ]  Orders Entered [ ] Foley Y/N    Prenatal History: G5P4004   EDC : 04/23/2021, by Ultrasound   03/28/21 (36 wks)  BPD:      90.2  mm     G. Age:  36w 4d         68  %    CI:        78.46   %    70 - 86                                                          FL/HC:      20.1   %    20.1 - 22.1  HC:      322.1  mm     G. Age:  36w 3d         23  %    HC/AC:      0.86        0.93 - 1.11  AC:       374   mm     G. Age:  41w 2d       > 99  %    FL/BPD:     71.6   %    71 - 87   FL:       64.6  mm     G. Age:  33w 2d        1.5  %    FL/AC:      17.3   %    20 - 24  HUM:        55  mm     G. Age:  32w 0d        <  5  %  LV:        4.3  mm  Est. FW:    3538  gm    7 lb 13 oz      97  %  Prenatal course complicated by: Chronic Hypertension Gestational Diabetes Mellitus  Multigravida of Advanced Maternal Age  Prenatal Labs: ABO, Rh: O (05/26 1643)  Antibody: NEG (11/06 0800) Rubella: 11.20 (05/26 1643)  RPR: Non Reactive (08/18 0911)  HBsAg: Negative (05/26 1643)  HIV: Non Reactive (08/18 0911)  GBS: Negative/-- (11/02 1453)  1 hr Glucola : Failed Genetic Screening: Normal NIPS Ultrasound: EFW 97%  TDaP          Declined COVID-19 UTD    Maternal Diabetes: Yes:  Diabetes Type:  Insulin/Medication controlled Genetic Screening: Normal Maternal Ultrasounds/Referrals: Normal and Other: Short long bones per 36 weeks  Fetal Ultrasounds or other Referrals:  None Maternal Substance Abuse:  No  Significant Maternal Lab Results:  Group B Strep negative Other Comments:  None  Medical / Surgical History : Past medical history:  Past Medical History:  Diagnosis Date   Diabetes mellitus without complication (HCC)    GDM   Hypertension    Pregnancy induced hypertension     Past surgical history:  Past Surgical History:  Procedure Laterality Date   NO PAST SURGERIES      Family History:  Family History  Problem Relation Age of Onset   Hypertension Mother    Diabetes Father    Diabetes Maternal Grandmother    Cancer Paternal Grandfather     Social History:  reports that she has never smoked. She has never used smokeless tobacco. She reports that she does not drink alcohol and does not use drugs. Allergies: Patient has no known allergies.   Current Medications at time of admission:  Medications Prior to Admission  Medication Sig Dispense Refill Last Dose   aspirin EC 81 MG tablet Take 1 tablet (81 mg total) by mouth daily. Take after 12 weeks for  prevention of preeclampsia later in pregnancy 300 tablet 2    metFORMIN (GLUCOPHAGE) 1000 MG tablet Take 1 tablet (1,000 mg total) by mouth 2 (two) times daily with a meal. 60 tablet 3    Prenatal Vit-Fe Fumarate-FA (PRENATAL PO) Take by mouth.        Review of Systems: Review of Systems  Constitutional: Negative.   Eyes: Negative.   Gastrointestinal: Negative.   Neurological: Negative.   Denies HA, vision changes or epigastric pain.  Physical Exam: Vital signs and nursing notes reviewed.  Patient Vitals for the past 24 hrs:  BP Temp Temp src Pulse Resp Height Weight  04/16/21 0943 (!) 146/95 -- -- 77 -- -- --  04/16/21 0746 -- -- -- -- -- 5' (1.524 m) 91.9 kg  04/16/21 0727 -- 99.2 F (37.3 C) Oral -- 20 -- --  04/16/21 0726 (!) 157/94 -- -- 75 -- -- --     General: AAO x 3, NAD Heart: RRR Lungs:CTAB Abdomen: Gravid, NT Extremities: no edema Genitalia / VE: Dilation: 2 Effacement (%): Thick Cervical Position: Posterior Station: -3 Presentation: Vertex Exam by:: J.Cox, RN   FHR: 135 BPM, moderate variability, + accels, no decels TOCO: Irregular Contractions   Labs:   Pending T&S, CBC, RPR  Recent Labs    04/16/21 0715  WBC 7.6  HGB 13.0  HCT 37.8  PLT 197    Assessment:  40 y.o. [id number] at [redacted]w[redacted]d IOL for Chronic HTN and Gestational  Diabetes Mellitus on Metformin, Advanced maternal age of multigravida.   1. 1st stage of labor 2. FHR category 1 3. GBS Negative 4. Desires Epidural  5. Plans to have SVD 6. A2GDM. Uncontrolled fasting CBGs.   7. Suspected fetal macrosomia w/ AC > 99%. EFW ~ 9 lb per Leopold's and extrapolation of last [place] results.  8. CHTN w/ out evidence of Pre-E.  Plan:  1. Admit to BS 2. Routine L&D orders 3. Analgesia/anesthesia PRN  4. Expectant management 5. PEC  labs 6. Pitocin if no cervical change 7. Monitor BP's and blood sugars during labor 8. Anticipate NSVB 9. No indication for offering primary C/S at this time. Will watch  labor curve closely. Shoulder Dystocia precautions.    Iline Oven White SNM 04/16/2021, 9:57 AM   I was present for the exam and agree with above. Continue Metformin in early labor.  BP meds PRN. Pre-labs Nml. No indication for offering primary C/S at this time. Will watch labor curve closely. Shoulder Dystocia precautions.    Katrinka Blazing, [place], CNM 04/16/2021 1:54 PM

## 2021-04-16 NOTE — Progress Notes (Signed)
S: Patient comfortable with epidural resting in bed Denies rectal and vaginal pressure. Discussed AROM and patient gives verbal consent.   O: Vitals:   04/16/21 1616 04/16/21 1631 04/16/21 1701 04/16/21 1731  BP: 133/75 (!) 141/81 (!) 149/78 (!) 149/77  Pulse: 83 82 86 89  Resp:      Temp:      TempSrc:      SpO2:      Weight:      Height:        FHT: FHR: 135 bpm, variability: moderate,  accelerations:  Present,  decelerations:  Absent UC: regular, every 1.5- 3 minutes  SVE:   Dilation: 5 Effacement (%): 70 Station: -2 Exam by: Katrinka Blazing, CNM AROM @ 1740 clear fluid  A / P: Induction of labor due to gestational diabetes and Chronic HTN,  progressing well on pitocin.   Fetal Wellbeing:  Category I Pain Control:  Epidural Anticipated MOD:  NSVD  Virginia Dyer 04/16/2021, 5:50 PM

## 2021-04-16 NOTE — Discharge Summary (Signed)
Postpartum Discharge Summary     Patient Name: Virginia Dyer DOB: [birth year] MRN: [id number]  Date of admission: 04/16/2021 Delivery date:04/16/2021  Delivering provider: Warner Mccreedy  Date of discharge: 04/18/2021  Admitting diagnosis: GDMa2 IOL Intrauterine pregnancy: [redacted]w[redacted]d     Secondary diagnosis:  cHTN. BMI 40. AMA. Accessory lobe of the placenta.   Discharge diagnosis: Term Pregnancy Delivered                                              Augmentation: AROM, Pitocin, and Cytotec Complications: Patient diagnosed with superimposed severe pre-eclampsia based on BPs during her labor.   Hospital course:  Patient had an uncomplicated labor course as follows: Membrane Rupture Time/Date: 5:40 PM ,04/16/2021   Delivery Method:Vaginal, Spontaneous  Episiotomy: None  Lacerations:  1st degree  Details of delivery can be found in separate delivery note. She completed 24 hours of PP Mg and was discharged to home on medications. She was restarted on metformin bid prior to discharge.   Newborn Data: Birth date:04/16/2021  Birth time:8:43 PM  Gender:Female  Living status:Living  Apgars:7 ,9  Weight:4139 g   Rhophylac:N/A  Physical exam  Vitals:   04/18/21 0324 04/18/21 0752 04/18/21 1219 04/18/21 1259  BP: 112/66 118/65 (!) 156/78 (!) 149/75  Pulse: 97 80 (!) 110 93  Resp: 18 18 18    Temp: 98.2 F (36.8 C) 98.3 F (36.8 C) 98.4 F (36.9 C)   TempSrc: Oral Oral Oral   SpO2: 99% 99% 99%   Weight:      Height:       General: alert Lochia: appropriate Uterine Fundus: firm, nttp Incision: N/A DVT Evaluation: No evidence of DVT seen on physical exam. Labs: Lab Results  Component Value Date   WBC 9.9 04/17/2021   HGB 9.3 (L) 04/17/2021   HCT 26.8 (L) 04/17/2021   MCV 91.8 04/17/2021   PLT 143 (L) 04/17/2021   CMP Latest Ref Rng & Units 04/17/2021  Glucose 70 - 99 mg/dL 13/12/2020)  BUN 6 - 20 mg/dL 5(L)  Creatinine 782(U - 1.00 mg/dL 2.35  Sodium 3.61 - 443 mmol/L 136   Potassium 3.5 - 5.1 mmol/L 3.5  Chloride 98 - 111 mmol/L 103  CO2 22 - 32 mmol/L 23  Calcium 8.9 - 10.3 mg/dL 7.5(L)  Total Protein 6.5 - 8.1 g/dL 5.0(L)  Total Bilirubin 0.3 - 1.2 mg/dL 0.3  Alkaline Phos 38 - 126 U/L 82  AST 15 - 41 U/L 27  ALT 0 - 44 U/L 15   Edinburgh Score: Edinburgh Postnatal Depression Scale Screening Tool 04/17/2021  I have been able to laugh and see the funny side of things. 0  I have looked forward with enjoyment to things. 0  I have blamed myself unnecessarily when things went wrong. 0  I have been anxious or worried for no good reason. 0  I have felt scared or panicky for no good reason. 0  Things have been getting on top of me. 1  I have been so unhappy that I have had difficulty sleeping. 0  I have felt sad or miserable. 0  I have been so unhappy that I have been crying. 0  The thought of harming myself has occurred to me. 0  Edinburgh Postnatal Depression Scale Total 1     After visit meds:  Allergies as of 04/18/2021  No Known Allergies      Medication List     STOP taking these medications    aspirin EC 81 MG tablet       TAKE these medications    acetaminophen 325 MG tablet Commonly known as: Tylenol Take 2 tablets (650 mg total) by mouth every 4 (four) hours as needed (for pain scale < 4).   ibuprofen 600 MG tablet Commonly known as: ADVIL Take 1 tablet (600 mg total) by mouth every 6 (six) hours as needed.   metFORMIN 1000 MG tablet Commonly known as: Glucophage Take 1 tablet (1,000 mg total) by mouth 2 (two) times daily with a meal.   NIFEdipine 30 MG 24 hr tablet Commonly known as: ADALAT CC Take 1 tablet (30 mg total) by mouth daily. Start taking on: April 19, 2021   PRENATAL PO Take by mouth.         Discharge home in stable condition Infant Feeding:  formula Infant Disposition:home with mother Discharge instruction: per After Visit Summary and Postpartum booklet. Activity: Advance as tolerated.  Pelvic rest for 6 weeks.  Diet: routine diet Future Appointments: Future Appointments  Date Time Provider Department Center  04/24/2021 10:20 AM [facility] NURSE [facility]  05/15/2021  8:15 AM Hermina Staggers, MD [facility]  05/15/2021  8:50 AM [facility]   Follow up Visit:  Follow-up Information     [facility] Follow up on 04/19/2021.   Specialty: Pediatrics Why: @ 8:30am Dr Reginold Agent information: [address] [place] [address] [phone]         [facility] Follow up in 6 day(s).   Specialty: Obstetrics and Gynecology Why: blood pressure check and blood sugar check Contact information: [address] [place] [address] [phone]               Message sent to [facility]  by Dr. Ephriam Jenkins on 04/16/2021  Please schedule this patient for a In person postpartum visit in 4 weeks with the following provider: MD. Additional Postpartum F/U:2 hour GTT and BP check 1 week  High risk pregnancy complicated by: GDM and HTN Delivery mode:  Vaginal, Spontaneous  Anticipated Birth Control:  she had depo provera and is planning on IUD at the [facility]    04/18/2021 Bonesteel Bing, MD

## 2021-04-16 NOTE — H&P (Signed)
Note in error.

## 2021-04-16 NOTE — Anesthesia Preprocedure Evaluation (Signed)
Anesthesia Evaluation  Patient identified by MRN, date of birth, ID band Patient awake    Reviewed: Allergy & Precautions, H&P , NPO status , Patient's Chart, lab work & pertinent test results  History of Anesthesia Complications Negative for: history of anesthetic complications  Airway Mallampati: II  TM Distance: >3 FB Neck ROM: full    Dental no notable dental hx. (+) Teeth Intact   Pulmonary neg pulmonary ROS,    Pulmonary exam normal breath sounds clear to auscultation       Cardiovascular hypertension, Normal cardiovascular exam Rhythm:regular Rate:Normal     Neuro/Psych negative neurological ROS  negative psych ROS   GI/Hepatic negative GI ROS, Neg liver ROS,   Endo/Other  diabetesMorbid obesity  Renal/GU negative Renal ROS  negative genitourinary   Musculoskeletal   Abdominal (+) + obese,   Peds  Hematology negative hematology ROS (+)   Anesthesia Other Findings   Reproductive/Obstetrics (+) Pregnancy                             Anesthesia Physical Anesthesia Plan  ASA: 3  Anesthesia Plan: Epidural   Post-op Pain Management:    Induction:   PONV Risk Score and Plan:   Airway Management Planned:   Additional Equipment:   Intra-op Plan:   Post-operative Plan:   Informed Consent: I have reviewed the patients History and Physical, chart, labs and discussed the procedure including the risks, benefits and alternatives for the proposed anesthesia with the patient or authorized representative who has indicated his/her understanding and acceptance.       Plan Discussed with:   Anesthesia Plan Comments:         Anesthesia Quick Evaluation

## 2021-04-16 NOTE — Progress Notes (Signed)
S: Patient overall feeling fine. Reports contractions getting closer together and now requesting epidural for pain relief. Husband Sharlet Salina at bedside. Spanish interpreter at bedside. Denies PEC symptoms.   O: Vitals:   04/16/21 1131 04/16/21 1158 04/16/21 1200 04/16/21 1302  BP: 136/88 138/80  (!) 149/86  Pulse: 78 84  76  Resp:   (P) 18   Temp:   (P) 98.9 F (37.2 C)   TempSrc:   (P) Oral   Weight:      Height:      Severe range BP treated with IV Labetalol.   FHT:  FHR: 145 bpm, variability: moderate,  accelerations:  Present,  decelerations:  Absent UC:   regular, every 2-3 minutes SVE:   Dilation: 3.5 Effacement (%): 60 Station: -3 Exam by: Katrinka Blazing, CNM  A / P: Induction of labor due to Gestational Diabetes and Chronic HTN,  progressing well on Vaginal Cytotec. Plan to start Pitocin after Epidural placement. AROM when baby well applied to cervix.    Fetal Wellbeing:  Category I Pain Control:  Epidural Anticipated MOD:  NSVD  Trellis Moment, SNM 04/16/2021, 1:45 PM

## 2021-04-17 ENCOUNTER — Encounter (HOSPITAL_COMMUNITY): Payer: Self-pay | Admitting: Obstetrics and Gynecology

## 2021-04-17 LAB — CBC
HCT: 31.1 % — ABNORMAL LOW (ref 36.0–46.0)
HCT: 26.8 % — ABNORMAL LOW (ref 36.0–46.0)
Hemoglobin: 10.7 g/dL — ABNORMAL LOW (ref 12.0–15.0)
Hemoglobin: 9.3 g/dL — ABNORMAL LOW (ref 12.0–15.0)
MCH: 31.4 pg (ref 26.0–34.0)
MCH: 31.8 pg (ref 26.0–34.0)
MCHC: 34.4 g/dL (ref 30.0–36.0)
MCHC: 34.7 g/dL (ref 30.0–36.0)
MCV: 91.2 fL (ref 80.0–100.0)
MCV: 91.8 fL (ref 80.0–100.0)
Platelets: 147 10*3/uL — ABNORMAL LOW (ref 150–400)
Platelets: 143 10*3/uL — ABNORMAL LOW (ref 150–400)
RBC: 3.41 MIL/uL — ABNORMAL LOW (ref 3.87–5.11)
RBC: 2.92 MIL/uL — ABNORMAL LOW (ref 3.87–5.11)
RDW: 13.5 % (ref 11.5–15.5)
RDW: 13.9 % (ref 11.5–15.5)
WBC: 15.7 10*3/uL — ABNORMAL HIGH (ref 4.0–10.5)
WBC: 9.9 10*3/uL (ref 4.0–10.5)
nRBC: 0 % (ref 0.0–0.2)
nRBC: 0 % (ref 0.0–0.2)

## 2021-04-17 LAB — COMPREHENSIVE METABOLIC PANEL
ALT: 15 U/L (ref 0–44)
AST: 27 U/L (ref 15–41)
Albumin: 2.2 g/dL — ABNORMAL LOW (ref 3.5–5.0)
Alkaline Phosphatase: 82 U/L (ref 38–126)
Anion gap: 10 (ref 5–15)
BUN: 5 mg/dL — ABNORMAL LOW (ref 6–20)
CO2: 23 mmol/L (ref 22–32)
Calcium: 7.5 mg/dL — ABNORMAL LOW (ref 8.9–10.3)
Chloride: 103 mmol/L (ref 98–111)
Creatinine, Ser: 0.74 mg/dL (ref 0.44–1.00)
GFR, Estimated: >60 mL/min (ref >60)
Glucose, Bld: 178 mg/dL — ABNORMAL HIGH (ref 70–99)
Potassium: 3.5 mmol/L (ref 3.5–5.1)
Sodium: 136 mmol/L (ref 135–145)
Total Bilirubin: 0.3 mg/dL (ref 0.3–1.2)
Total Protein: 5 g/dL — ABNORMAL LOW (ref 6.5–8.1)

## 2021-04-17 LAB — GLUCOSE, CAPILLARY: Glucose-Capillary: 114 mg/dL — ABNORMAL HIGH (ref 70–99)

## 2021-04-17 LAB — RPR: RPR Ser Ql: NONREACTIVE

## 2021-04-17 MED ORDER — BENZOCAINE-MENTHOL 20-0.5 % EX AERO
1.0000 "application " | INHALATION_SPRAY | CUTANEOUS | Status: DC | PRN
  Administered 2021-04-17: 1 via TOPICAL
  Filled 2021-04-17: qty 56

## 2021-04-17 MED ORDER — PRENATAL MULTIVITAMIN CH
1.0000 | ORAL_TABLET | Freq: Every day | ORAL | Status: DC
  Administered 2021-04-17 – 2021-04-18 (×2): 1 via ORAL
  Filled 2021-04-17 (×2): qty 1

## 2021-04-17 MED ORDER — MAGNESIUM SULFATE 40 GM/1000ML IV SOLN
2.0000 g/h | INTRAVENOUS | Status: AC
  Administered 2021-04-17 (×2): 2 g/h via INTRAVENOUS
  Filled 2021-04-17: qty 1000

## 2021-04-17 MED ORDER — COCONUT OIL OIL: 1.0000 "application " | TOPICAL_OIL | Status: DC | PRN

## 2021-04-17 MED ORDER — ONDANSETRON HCL 4 MG/2ML IJ SOLN: 4.0000 mg | INTRAMUSCULAR | Status: DC | PRN

## 2021-04-17 MED ORDER — LACTATED RINGERS IV SOLN: INTRAVENOUS | Status: AC

## 2021-04-17 MED ORDER — WITCH HAZEL-GLYCERIN EX PADS: 1.0000 "application " | MEDICATED_PAD | CUTANEOUS | Status: DC | PRN

## 2021-04-17 MED ORDER — DIPHENHYDRAMINE HCL 25 MG PO CAPS: 25.0000 mg | ORAL_CAPSULE | Freq: Four times a day (QID) | ORAL | Status: DC | PRN

## 2021-04-17 MED ORDER — SIMETHICONE 80 MG PO CHEW: 80.0000 mg | CHEWABLE_TABLET | ORAL | Status: DC | PRN

## 2021-04-17 MED ORDER — ACETAMINOPHEN 325 MG PO TABS
650.0000 mg | ORAL_TABLET | ORAL | Status: DC | PRN
  Administered 2021-04-17 (×2): 650 mg via ORAL
  Filled 2021-04-17 (×2): qty 2

## 2021-04-17 MED ORDER — IBUPROFEN 600 MG PO TABS
600.0000 mg | ORAL_TABLET | Freq: Four times a day (QID) | ORAL | Status: DC
  Administered 2021-04-17 – 2021-04-18 (×7): 600 mg via ORAL
  Filled 2021-04-17 (×7): qty 1

## 2021-04-17 MED ORDER — MEDROXYPROGESTERONE ACETATE 150 MG/ML IM SUSP
150.0000 mg | INTRAMUSCULAR | Status: AC | PRN
  Administered 2021-04-18: 150 mg via INTRAMUSCULAR
  Filled 2021-04-17: qty 1

## 2021-04-17 MED ORDER — MEASLES, MUMPS & RUBELLA VAC IJ SOLR: 0.5000 mL | Freq: Once | INTRAMUSCULAR | Status: DC

## 2021-04-17 MED ORDER — METFORMIN HCL 500 MG PO TABS
1000.0000 mg | ORAL_TABLET | Freq: Two times a day (BID) | ORAL | Status: DC
  Administered 2021-04-17 – 2021-04-18 (×2): 1000 mg via ORAL
  Filled 2021-04-17 (×2): qty 2

## 2021-04-17 MED ORDER — ONDANSETRON HCL 4 MG PO TABS: 4.0000 mg | ORAL_TABLET | ORAL | Status: DC | PRN

## 2021-04-17 MED ORDER — DIBUCAINE (PERIANAL) 1 % EX OINT: 1.0000 "application " | TOPICAL_OINTMENT | CUTANEOUS | Status: DC | PRN

## 2021-04-17 MED ORDER — TETANUS-DIPHTH-ACELL PERTUSSIS 5-2.5-18.5 LF-MCG/0.5 IM SUSY: 0.5000 mL | PREFILLED_SYRINGE | Freq: Once | INTRAMUSCULAR | Status: DC

## 2021-04-17 MED ORDER — SENNOSIDES-DOCUSATE SODIUM 8.6-50 MG PO TABS
2.0000 | ORAL_TABLET | Freq: Every day | ORAL | Status: DC
  Administered 2021-04-17 – 2021-04-18 (×2): 2 via ORAL
  Filled 2021-04-17 (×2): qty 2

## 2021-04-17 NOTE — Anesthesia Postprocedure Evaluation (Signed)
Anesthesia Post Note  Patient: Virginia Dyer  Procedure(s) Performed: AN AD HOC LABOR EPIDURAL     Patient location during evaluation: OB High Risk Anesthesia Type: Epidural Level of consciousness: awake, oriented and awake and alert Pain management: pain level controlled Vital Signs Assessment: post-procedure vital signs reviewed and stable Respiratory status: spontaneous breathing, respiratory function stable and nonlabored ventilation Cardiovascular status: stable Postop Assessment: no headache, adequate PO intake, able to ambulate, patient able to bend at knees, no apparent nausea or vomiting and no backache Anesthetic complications: no   No notable events documented.  Last Vitals:  Vitals:   04/17/21 0617 04/17/21 0643  BP:    Pulse:    Resp: 18 18  Temp:    SpO2:      Last Pain:  Vitals:   04/17/21 0800  TempSrc:   PainSc: 0-No pain   Pain Goal:                   Allan Bacigalupi

## 2021-04-17 NOTE — Progress Notes (Signed)
Daily Postpartum Note  Admission Date: 04/16/2021 Current Date: 04/17/2021 9:34 AM  Virginia Dyer is a 40 y.o. [id number] PPD#1 SVD/1st degree (repaired), admitted for IOL for cHTN and dx with severe pre-eclampsia based on BPs during IOL.  Pregnancy complicated by: Patient Active Problem List   Diagnosis Date Noted   Gestational diabetes mellitus, class A2 04/16/2021   Vaginal delivery 04/16/2021   Chronic hypertension with superimposed preeclampsia 04/16/2021   Fetal renal anomaly, single gestation 12/27/2020   Placenta succenturiata, antepartum 12/27/2020   Chronic hypertension affecting pregnancy 12/01/2020   Obesity during pregnancy, antepartum 12/01/2020   Supervision of high risk pregnancy, antepartum 11/03/2020   Advanced maternal age in multigravida 11/03/2020   History of gestational diabetes in prior pregnancy, currently pregnant 11/03/2020   History of pre-eclampsia in prior pregnancy, currently pregnant 11/03/2020   Hypertension 02/28/2019    Overnight/24hr events:  none  Subjective:  No s/s of pre-eclampsia. Patient doing well  Objective:    Current Vital Signs 24h Vital Sign Ranges  T 98.2 F (36.8 C) Temp  Avg: 98.3 F (36.8 C)  Min: 98.1 F (36.7 C)  Max: 98.9 F (37.2 C)  BP 115/62 BP  Min: 97/59  Max: 185/110  HR 98 Pulse  Avg: 85  Min: 72  Max: 113  RR 18 Resp  Avg: 17.7  Min: 16  Max: 20  SaO2 100 %  (room air) SpO2  Avg: 98.3 %  Min: 96 %  Max: 100 %       24 Hour I/O Current Shift I/O  Time Ins Outs 11/06 0701 - 11/07 0700 In: 2585 [P.O.:320; I.V.:3823] Out: 2709 [Urine:1830] No intake/output data recorded.   Patient Vitals for the past 24 hrs:  BP Temp Temp src Pulse Resp SpO2  04/17/21 0902 115/62 98.2 F (36.8 C) Oral 98 18 100 %  04/17/21 0643 -- -- -- -- 18 --  04/17/21 0617 -- -- -- -- 18 --  04/17/21 0515 -- -- -- -- -- 98 %  04/17/21 0514 (!) 111/59 98.1 F (36.7 C) Oral 83 16 --  04/17/21 0501 (!) 97/59 -- -- 83 -- --   04/17/21 0401 (!) 98/52 -- -- 82 18 --  04/17/21 0300 (!) 109/54 -- -- 93 16 98 %  04/17/21 0215 -- -- -- -- 16 --  04/17/21 0200 (!) 103/53 -- -- 85 -- 98 %  04/17/21 0100 121/62 -- -- 90 18 --  04/17/21 0053 -- 98.1 F (36.7 C) Oral -- -- --  04/17/21 0001 117/63 -- -- 88 20 --  04/16/21 2326 -- 98.3 F (36.8 C) Oral -- -- --  04/16/21 2316 119/71 -- -- 89 -- --  04/16/21 2301 (!) 107/59 -- -- 89 -- --  04/16/21 2246 124/62 -- -- 86 -- --  04/16/21 2231 (!) 112/54 -- -- 84 -- --  04/16/21 2216 114/69 -- -- 92 -- --  04/16/21 2146 (!) 118/58 -- -- 86 -- --  04/16/21 2130 138/68 -- -- 86 -- --  04/16/21 2121 (!) 141/75 -- -- 83 -- --  04/16/21 2116 (!) 143/76 -- -- 87 -- --  04/16/21 2103 140/84 -- -- 85 -- --  04/16/21 2100 (!) 99/31 -- -- (!) 113 -- --  04/16/21 2040 -- -- -- -- -- 98 %  04/16/21 2035 -- -- -- -- -- 98 %  04/16/21 2033 (!) 158/83 -- -- 81 -- --  04/16/21 2030 -- -- -- -- -- 98 %  04/16/21  2000 (!) 156/85 -- -- 86 -- 98 %  04/16/21 1930 113/63 -- -- 85 -- 96 %  04/16/21 1921 121/60 -- -- 88 -- --  04/16/21 1900 (!) 147/79 -- -- 87 -- 97 %  04/16/21 1858 -- -- -- -- 18 --  04/16/21 1855 -- -- -- -- -- 98 %  04/16/21 1851 (!) 150/79 -- -- 86 -- --  04/16/21 1850 -- -- -- -- -- 99 %  04/16/21 1847 (!) 158/82 -- -- 86 18 --  04/16/21 1845 -- -- -- -- -- 98 %  04/16/21 1841 (!) 148/80 -- -- 86 -- --  04/16/21 1834 (!) 160/86 -- -- 86 -- --  04/16/21 1816 (!) 161/83 -- -- 97 -- --  04/16/21 1800 (!) 160/86 -- -- 82 -- --  04/16/21 1731 (!) 149/77 -- -- 89 -- --  04/16/21 1701 (!) 149/78 -- -- 86 -- --  04/16/21 1631 (!) 141/81 -- -- 82 -- --  04/16/21 1616 133/75 -- -- 83 -- --  04/16/21 1604 -- 98.1 F (36.7 C) Oral -- -- --  04/16/21 1600 (!) 160/84 -- -- 79 18 --  04/16/21 1531 (!) 145/90 -- -- 79 -- --  04/16/21 1501 (!) 148/88 -- -- 73 -- --  04/16/21 1451 (!) 146/89 -- -- 81 -- --  04/16/21 1450 -- -- -- -- -- 99 %  04/16/21 1446 (!) 161/88 -- --  79 -- --  04/16/21 1445 -- -- -- -- -- 99 %  04/16/21 1440 (!) 144/90 -- -- 81 -- 99 %  04/16/21 1435 (!) 147/86 -- -- 74 -- 98 %  04/16/21 1430 (!) 146/84 -- -- 79 -- 98 %  04/16/21 1425 (!) 151/85 -- -- 80 -- 99 %  04/16/21 1421 (!) 168/88 -- -- 88 -- --  04/16/21 1420 -- -- -- -- -- 99 %  04/16/21 1417 (!) 173/103 -- -- 90 -- --  04/16/21 1416 (!) 185/110 -- -- 95 -- --  04/16/21 1302 (!) 149/86 -- -- 76 -- --  04/16/21 1200 -- 98.9 F (37.2 C) Oral -- 18 --  04/16/21 1158 138/80 -- -- 84 -- --  04/16/21 1131 136/88 -- -- 78 -- --  04/16/21 1116 (!) 169/106 -- -- 82 -- --  04/16/21 1101 (!) 161/96 -- -- 72 -- --  04/16/21 0943 (!) 146/95 -- -- 77 -- --  UOP: >168m/hr  Physical exam: General: Well nourished, well developed female in no acute distress. Abdomen: obese, nttp Cardiovascular: S1, S2 normal, no murmur, rub or gallop, regular rate and rhythm Respiratory: CTAB Extremities: no clubbing, cyanosis or edema Skin: Warm and dry.   Medications: Current Facility-Administered Medications  Medication Dose Route Frequency Provider Last Rate Last Admin   acetaminophen (TYLENOL) tablet 650 mg  650 mg Oral Q4H PRN DRenard Matter MD   650 mg at 04/17/21 0611   benzocaine-Menthol (DERMOPLAST) 20-0.5 % topical spray 1 application  1 application Topical PRN DRenard Matter MD   1 application at 140/81/440111   coconut oil  1 application Topical PRN DRenard Matter MD       witch hazel-glycerin (TUCKS) pad 1 application  1 application Topical PRN DRenard Matter MD       And   dibucaine (NUPERCAINAL) 1 % rectal ointment 1 application  1 application Rectal PRN DRenard Matter MD       diphenhydrAMINE (BENADRYL) capsule 25 mg  25 mg Oral Q6H PRN DRenard Matter MD  furosemide (LASIX) injection 10 mg  10 mg Intravenous Q12H Renard Matter, MD   10 mg at 04/17/21 0111   labetalol (NORMODYNE) injection 20 mg  20 mg Intravenous PRN Renard Matter, MD   20 mg at 04/16/21 1823   And   labetalol (NORMODYNE) injection  40 mg  40 mg Intravenous PRN Renard Matter, MD   40 mg at 04/16/21 1834   And   labetalol (NORMODYNE) injection 80 mg  80 mg Intravenous PRN Renard Matter, MD       And   hydrALAZINE (APRESOLINE) injection 10 mg  10 mg Intravenous PRN Renard Matter, MD       ibuprofen (ADVIL) tablet 600 mg  600 mg Oral Q6H Renard Matter, MD   600 mg at 04/17/21 9432   lactated ringers infusion   Intravenous Continuous Aletha Halim, MD 75 mL/hr at 04/17/21 0117 New Bag at 04/17/21 0117   magnesium sulfate 40 grams in SWI 1000 mL OB infusion  2 g/hr Intravenous Continuous Aletha Halim, MD 50 mL/hr at 04/17/21 0121 2 g/hr at 04/17/21 0121   measles, mumps & rubella vaccine (MMR) injection 0.5 mL  0.5 mL Subcutaneous Once Renard Matter, MD       medroxyPROGESTERone (DEPO-PROVERA) injection 150 mg  150 mg Intramuscular Prior to discharge Renard Matter, MD       ondansetron [facility]) tablet 4 mg  4 mg Oral Q4H PRN Renard Matter, MD       Or   ondansetron [facility]) injection 4 mg  4 mg Intravenous Q4H PRN Renard Matter, MD       prenatal multivitamin tablet 1 tablet  1 tablet Oral Q1200 Renard Matter, MD       senna-docusate (Senokot-S) tablet 2 tablet  2 tablet Oral Daily Renard Matter, MD       simethicone [facility]) chewable tablet 80 mg  80 mg Oral PRN Renard Matter, MD       Tdap Durwin Reges) injection 0.5 mL  0.5 mL Intramuscular Once Renard Matter, MD        Labs:  Recent Labs  Lab 04/16/21 1318 04/16/21 2325 04/17/21 0222  WBC 8.4 18.6* 15.7*  HGB 12.6 11.2* 10.7*  HCT 37.0 31.5* 31.1*  PLT 187 154 147*    Recent Labs  Lab 04/16/21 0715  NA 136  K 3.9  CL 106  CO2 21*  BUN 8  CREATININE 0.52  CALCIUM 9.1  PROT 5.9*  BILITOT 0.6  ALKPHOS 111  ALT 19  AST 26  GLUCOSE 90   Results for SHANESSA, HODAK (MRN [id number]) as of 04/17/2021 09:36  Ref. Range 04/16/2021 07:53 04/16/2021 12:00 04/16/2021 16:04 04/16/2021 20:04 04/17/2021 06:17  Glucose-Capillary Latest Ref Range: 70 - 99 mg/dL 96 92 82 103 (H) 114 (H)     Radiology:  No new imaging  Assessment & Plan:  Pt doing well *PP: O POS. Breast and formula. Girl. Depo provera (ordered) *CHTN with severe pre-eclampsia: on no meds now except for two doses of lasix. Follow BPs off Mg. Mg until 2030 tonight *GDMa2: on metformin during pregnancy and currently on nothing now. Continue with qday fasting CBGs *PPx: SCDs, OOB ad lib once off Mg *FEN/GI: regular diet, MIVF with Mg *Dispo: likely tomorrow  Interpreter used  Durene Romans MD Attending Center for [place] (Faculty Practice) GYN Consult Phone: [phone] (M-F, [phone]) & [phone]  (Off hours, weekends, holidays)

## 2021-04-17 NOTE — Lactation Note (Signed)
This note was copied from a baby's chart. Lactation Consultation Note  Patient Name: Virginia Dyer ORVIF'B Date: 04/17/2021 Reason for consult: Initial assessment;Maternal endocrine disorder Age:40 hours  Video interpreter used for Spanish.  P5, Baby latched upon entering with intermittent swallows. Mother states she would like to breastfeed and formula feed. Supplemental guidelines given.  Basics reviewed. No questions or concerns. Encouraged breastfeeding before formula to help establish milk supply.      Maternal Data Has patient been taught Hand Expression?: Yes Does the patient have breastfeeding experience prior to this delivery?: Yes How long did the patient breastfeed?: 1.5  Feeding Mother's Current Feeding Choice: Breast Milk and Formula  LATCH Score Latch: Grasps breast easily, tongue down, lips flanged, rhythmical sucking. (Latched upon entering)  Audible Swallowing: A few with stimulation  Type of Nipple: Everted at rest and after stimulation  Comfort (Breast/Nipple): Soft / non-tender  Hold (Positioning): No assistance needed to correctly position infant at breast.  LATCH Score: 9   Lactation Tools Discussed/Used    Interventions Interventions: Breast feeding basics reviewed;LC Services brochure;Breast compression  Discharge    Consult Status Consult Status: Follow-up Date: 04/18/21 Follow-up type: In-patient    Dahlia Byes [facility] 04/17/2021, 8:39 AM

## 2021-04-18 ENCOUNTER — Other Ambulatory Visit (HOSPITAL_COMMUNITY): Payer: Self-pay

## 2021-04-18 LAB — GLUCOSE, CAPILLARY: Glucose-Capillary: 74 mg/dL (ref 70–99)

## 2021-04-18 MED ORDER — NIFEDIPINE ER 30 MG PO TB24
30.0000 mg | ORAL_TABLET | Freq: Every day | ORAL | 0 refills | Status: DC
  Filled 2021-04-18: qty 42, 42d supply, fill #0

## 2021-04-18 MED ORDER — IBUPROFEN 600 MG PO TABS: 600.0000 mg | ORAL_TABLET | Freq: Four times a day (QID) | ORAL | 0 refills | Status: DC | PRN

## 2021-04-18 MED ORDER — NIFEDIPINE ER OSMOTIC RELEASE 30 MG PO TB24
30.0000 mg | ORAL_TABLET | Freq: Every day | ORAL | Status: DC
  Administered 2021-04-18: 30 mg via ORAL
  Filled 2021-04-18: qty 1

## 2021-04-18 MED ORDER — ACETAMINOPHEN 325 MG PO TABS: 650.0000 mg | ORAL_TABLET | ORAL | Status: DC | PRN

## 2021-04-18 NOTE — Plan of Care (Signed)
  Problem: Health Behavior/Discharge Planning: Goal: Ability to manage health-related needs will improve Outcome: Adequate for Discharge   Problem: Clinical Measurements: Goal: Ability to maintain clinical measurements within normal limits will improve Outcome: Adequate for Discharge   Problem: Clinical Measurements: Goal: Cardiovascular complication will be avoided Outcome: Adequate for Discharge

## 2021-04-18 NOTE — Plan of Care (Signed)
  Problem: Education: Goal: Knowledge of General Education information will improve Description: Including pain rating scale, medication(s)/side effects and non-pharmacologic comfort measures Outcome: Completed/Met   Problem: Activity: Goal: Risk for activity intolerance will decrease Outcome: Completed/Met   Problem: Nutrition: Goal: Adequate nutrition will be maintained Outcome: Completed/Met   Problem: Coping: Goal: Level of anxiety will decrease Outcome: Completed/Met   Problem: Elimination: Goal: Will not experience complications related to urinary retention Outcome: Completed/Met   Problem: Education: Goal: Knowledge of condition will improve Outcome: Completed/Met   Problem: Activity: Goal: Will verbalize the importance of balancing activity with adequate rest periods Outcome: Completed/Met Goal: Ability to tolerate increased activity will improve Outcome: Completed/Met   Problem: Coping: Goal: Ability to identify and utilize available resources and services will improve Outcome: Completed/Met   Problem: Role Relationship: Goal: Ability to demonstrate positive interaction with newborn will improve Outcome: Completed/Met

## 2021-04-18 NOTE — Discharge Instructions (Signed)
Keep checking your blood sugars 2-4 times a day (morning fasting and 2 hours after each meal) and we will see what your numbers are like at your 1 week follow up visit.

## 2021-04-19 ENCOUNTER — Other Ambulatory Visit: Payer: Self-pay

## 2021-04-19 ENCOUNTER — Encounter: Payer: Self-pay | Admitting: Family Medicine

## 2021-04-24 ENCOUNTER — Other Ambulatory Visit: Payer: Self-pay

## 2021-04-24 ENCOUNTER — Ambulatory Visit (INDEPENDENT_AMBULATORY_CARE_PROVIDER_SITE_OTHER): Payer: Self-pay | Admitting: General Practice

## 2021-04-24 VITALS — BP 135/86 | HR 86 | Ht 60.0 in | Wt 177.0 lb

## 2021-04-24 DIAGNOSIS — Z0130 Encounter for examination of blood pressure without abnormal findings: Secondary | ICD-10-CM

## 2021-04-24 NOTE — Progress Notes (Signed)
Agree with nurses's documentation of this patient's clinic encounter.  Dace Denn L, MD  

## 2021-04-24 NOTE — Progress Notes (Signed)
Patient presents to office today for BP check following vaginal delivery on 11/6. Patient's hx is significant for [facility] and pre-eclampsia with past pregnancies. She was discharged from the hospital on Nifedipine 30mg . She states the first three days at home after discharge she had a headache but none since then. Patient reports checking her BP at home frequently and it is always normal. BP 147/91 & 135/86. Patient states she hasn't taken her medication yet today because she tries to take it at 10am like they did in the hospital. Encouraged patient to take Nifedipine in the morning with breakfast instead of sticking to a strict time each day to make it easier on her. She verbalized understanding. Patient will follow up at Pp visit 12/5. Raquel assisted with spanish interpretation.  14/5 RN BSN 04/24/21

## 2021-05-01 ENCOUNTER — Telehealth (HOSPITAL_COMMUNITY): Payer: Self-pay | Admitting: *Deleted

## 2021-05-01 NOTE — Telephone Encounter (Signed)
Interpreter reports mom is feeling good. No concerns about herself at this time. EPDS=0 [facility] score=1 ) Interpreter reports baby is doing well. Feeding, peeing, and pooping without difficulty. Safe sleep reviewed. Mom reports no concerns about baby at present.  Duffy Rhody, RN 05-01-2021 at 11:32am

## 2021-05-12 ENCOUNTER — Other Ambulatory Visit: Payer: Self-pay | Admitting: *Deleted

## 2021-05-12 DIAGNOSIS — O24429 Gestational diabetes mellitus in childbirth, unspecified control: Secondary | ICD-10-CM

## 2021-05-15 ENCOUNTER — Other Ambulatory Visit: Payer: Self-pay

## 2021-05-15 ENCOUNTER — Ambulatory Visit (INDEPENDENT_AMBULATORY_CARE_PROVIDER_SITE_OTHER): Payer: Self-pay | Admitting: Obstetrics and Gynecology

## 2021-05-15 ENCOUNTER — Ambulatory Visit: Payer: Self-pay | Admitting: Obstetrics and Gynecology

## 2021-05-15 ENCOUNTER — Encounter: Payer: Self-pay | Admitting: Obstetrics and Gynecology

## 2021-05-15 VITALS — BP 143/92 | HR 79 | Wt 176.6 lb

## 2021-05-15 DIAGNOSIS — O119 Pre-existing hypertension with pre-eclampsia, unspecified trimester: Secondary | ICD-10-CM

## 2021-05-15 DIAGNOSIS — O24429 Gestational diabetes mellitus in childbirth, unspecified control: Secondary | ICD-10-CM

## 2021-05-15 DIAGNOSIS — O24419 Gestational diabetes mellitus in pregnancy, unspecified control: Secondary | ICD-10-CM

## 2021-05-15 NOTE — Progress Notes (Signed)
    Post Partum Visit Note  Virginia Dyer is a 40 y.o. [id number] female who presents for a postpartum visit. She is 4 weeks postpartum following a normal spontaneous vaginal delivery.  I have fully reviewed the prenatal and intrapartum course. The delivery was at 39/0 gestational weeks.  Anesthesia: epidural. Postpartum course has been unremarkable. Baby is doing well. Baby is feeding by both breast and bottle - [employer] Good Start. Bleeding no bleeding. Bowel function is normal. Bladder function is normal. Patient is not sexually active. Contraception method is Depo-Provera injections. Postpartum depression screening: negative.   The pregnancy intention screening data noted above was reviewed. Potential methods of contraception were discussed. The patient elected to proceed with No data recorded.    Health Maintenance Due  Topic Date Due   Hepatitis C Screening  Never done    Medical Records  Review of Systems Pertinent items noted in HPI and remainder of comprehensive ROS otherwise negative.  Objective:  LMP 07/26/2020 (Approximate)    General:  alert   Breasts:  not indicated  Lungs: clear to auscultation bilaterally  Heart:  regular rate and rhythm, S1, S2 normal, no murmur, click, rub or gallop  Abdomen: soft, non-tender; bowel sounds normal; no masses,  no organomegaly   Wound NA  GU exam:  not indicated       Assessment:    There are no diagnoses linked to this encounter.  Nl postpartum exam.  H/O CHTN H/O GDM  Plan:   Essential components of care per ACOG recommendations:  1.  Mood and well being: Patient with negative depression screening today. Reviewed local resources for support.  - Patient tobacco use? No.   - hx of drug use? No.    2. Infant care and feeding:  -Patient currently breastmilk feeding? Yes -Social determinants of health (SDOH) reviewed in EPIC. No concerns  3. Sexuality, contraception and birth spacing - Patient does not want a  pregnancy in the next year.  Desired family size is uncertain  - Reviewed forms of contraception in tiered fashion. Patient desired Depo-Provera today.   - Discussed birth spacing of 18 months  4. Sleep and fatigue -Encouraged family/partner/community support of 4 hrs of uninterrupted sleep to help with mood and fatigue  5. Physical Recovery  - Discussed patients delivery and complications. She describes her labor as good. - Patient had a Vaginal, no problems at delivery. Patient had a 1st degree laceration. Perineal healing reviewed. Patient expressed understanding - Patient has urinary incontinence? No. - Patient is safe to resume physical and sexual activity  6.  Health Maintenance - HM due items addressed Yes - Last pap smear  Diagnosis  Date Value Ref Range Status  11/03/2020   Final   - Negative for intraepithelial lesion or malignancy (NILM)   Pap smear not done at today's visit.  -Breast Cancer screening indicated? Yes. Patient referred today for mammogram.   7. Chronic Disease/Pregnancy Condition follow up: Hypertension and Gestational Diabetes  Referred to [facility] for mammogram and [facility] for continued management of HTN and DM  Nettie Elm, MD Center for [employer], [facility]

## 2021-05-15 NOTE — Patient Instructions (Signed)

## 2021-05-16 LAB — GLUCOSE TOLERANCE, 2 HOURS
Glucose, 2 hour: 190 mg/dL — ABNORMAL HIGH (ref 70–139)
Glucose, GTT - Fasting: 123 mg/dL — ABNORMAL HIGH (ref 70–99)

## 2021-07-10 ENCOUNTER — Ambulatory Visit (INDEPENDENT_AMBULATORY_CARE_PROVIDER_SITE_OTHER): Payer: Medicaid Other | Admitting: *Deleted

## 2021-07-10 ENCOUNTER — Other Ambulatory Visit: Payer: Self-pay

## 2021-07-10 VITALS — BP 118/74 | HR 86 | Ht 58.66 in | Wt 176.1 lb

## 2021-07-10 DIAGNOSIS — Z3042 Encounter for surveillance of injectable contraceptive: Secondary | ICD-10-CM

## 2021-07-10 DIAGNOSIS — I10 Essential (primary) hypertension: Secondary | ICD-10-CM

## 2021-07-10 MED ORDER — NIFEDIPINE ER 30 MG PO TB24: 30.0000 mg | ORAL_TABLET | Freq: Every day | ORAL | 0 refills | Status: AC

## 2021-07-10 MED ORDER — MEDROXYPROGESTERONE ACETATE 150 MG/ML IM SUSP
150.0000 mg | Freq: Once | INTRAMUSCULAR | Status: AC
  Administered 2021-07-10: 150 mg via INTRAMUSCULAR

## 2021-07-10 NOTE — Progress Notes (Signed)
Here for depo-provera injection. Given without complaint. Will make appointment for 70months 09/25/21- 10/09/21. Last annual was postpartum visit 05/15/21. Last pap was 10/14/20. C/o needs refill bp med and that it makes her get headache. Informed her needs to see PCP for management of HTN since she is past postpartum visit now Referred to [facility]. Asked Dr. Vergie Living to change med or refill for one month to give her time to get to PCP. Refill aproved. Patient voices understanding.  Moise Friday,RN

## 2021-07-13 NOTE — Progress Notes (Signed)
Patient was assessed and managed by nursing staff during this encounter. I have reviewed the chart and agree with the documentation and plan. I have also made any necessary editorial changes. ° °Zalayah Pizzuto, MD °07/13/2021 8:48 AM  ° °

## 2021-09-25 ENCOUNTER — Ambulatory Visit: Payer: Self-pay

## 2021-09-26 ENCOUNTER — Ambulatory Visit (INDEPENDENT_AMBULATORY_CARE_PROVIDER_SITE_OTHER): Payer: Self-pay

## 2021-09-26 VITALS — BP 131/91 | HR 87 | Wt 180.0 lb

## 2021-09-26 DIAGNOSIS — Z3042 Encounter for surveillance of injectable contraceptive: Secondary | ICD-10-CM

## 2021-09-26 MED ORDER — MEDROXYPROGESTERONE ACETATE 150 MG/ML IM SUSP
150.0000 mg | Freq: Once | INTRAMUSCULAR | Status: AC
  Administered 2021-09-26: 150 mg via INTRAMUSCULAR

## 2021-09-26 NOTE — Progress Notes (Signed)
Virginia Dyer here for Depo-Provera Injection. Injection administered without complication. Patient will return in 3 months for next injection between 12/12/21 and 12/27/22. Next annual visit due December 2023.  ? ?Marjo Bicker, RN ?09/26/2021  2:30 PM ? ?

## 2021-12-13 ENCOUNTER — Other Ambulatory Visit: Payer: Self-pay

## 2021-12-13 ENCOUNTER — Ambulatory Visit: Payer: Self-pay
